# Patient Record
Sex: Female | Born: 1960
Health system: Southern US, Community
[De-identification: ages and names within clinical notes are randomized; demographics above are authoritative.]

## PROBLEM LIST (undated history)

## (undated) DIAGNOSIS — G039 Meningitis, unspecified: Secondary | ICD-10-CM

## (undated) DIAGNOSIS — R002 Palpitations: Secondary | ICD-10-CM

## (undated) DIAGNOSIS — B001 Herpesviral vesicular dermatitis: Secondary | ICD-10-CM

## (undated) DIAGNOSIS — H269 Unspecified cataract: Secondary | ICD-10-CM

## (undated) DIAGNOSIS — K589 Irritable bowel syndrome without diarrhea: Secondary | ICD-10-CM

## (undated) DIAGNOSIS — E7800 Pure hypercholesterolemia, unspecified: Secondary | ICD-10-CM

## (undated) DIAGNOSIS — K219 Gastro-esophageal reflux disease without esophagitis: Secondary | ICD-10-CM

## (undated) DIAGNOSIS — B009 Herpesviral infection, unspecified: Secondary | ICD-10-CM

## (undated) HISTORY — DX: Unspecified cataract: H26.9

## (undated) HISTORY — DX: Irritable bowel syndrome, unspecified: K58.9

## (undated) HISTORY — PX: COLONOSCOPY: SHX174

## (undated) HISTORY — DX: Pure hypercholesterolemia, unspecified: E78.00

## (undated) HISTORY — DX: Gastro-esophageal reflux disease without esophagitis: K21.9

## (undated) HISTORY — DX: Meningitis, unspecified: G03.9

## (undated) HISTORY — DX: Herpesviral infection, unspecified: B00.9

## (undated) HISTORY — DX: Palpitations: R00.2

## (undated) HISTORY — PX: OTHER SURGICAL HISTORY: SHX169

## (undated) HISTORY — DX: Herpesviral vesicular dermatitis: B00.1

---

## 1990-08-11 DIAGNOSIS — G039 Meningitis, unspecified: Secondary | ICD-10-CM

## 1990-08-11 HISTORY — DX: Meningitis, unspecified: G03.9

## 1993-08-11 HISTORY — PX: ANKLE FRACTURE SURGERY: SHX122

## 1998-09-14 ENCOUNTER — Emergency Department (HOSPITAL_COMMUNITY): Admission: EM | Admit: 1998-09-14 | Discharge: 1998-09-14 | Payer: Self-pay | Admitting: Emergency Medicine

## 1999-06-17 ENCOUNTER — Other Ambulatory Visit: Admission: RE | Admit: 1999-06-17 | Discharge: 1999-06-17 | Payer: Self-pay | Admitting: Orthopedic Surgery

## 2001-10-11 ENCOUNTER — Other Ambulatory Visit: Admission: RE | Admit: 2001-10-11 | Discharge: 2001-10-11 | Payer: Self-pay | Admitting: Obstetrics and Gynecology

## 2002-06-09 ENCOUNTER — Other Ambulatory Visit: Admission: RE | Admit: 2002-06-09 | Discharge: 2002-06-09 | Payer: Self-pay | Admitting: Obstetrics and Gynecology

## 2002-10-18 ENCOUNTER — Encounter: Payer: Self-pay | Admitting: Obstetrics and Gynecology

## 2002-10-18 ENCOUNTER — Encounter: Admission: RE | Admit: 2002-10-18 | Discharge: 2002-10-18 | Payer: Self-pay | Admitting: Obstetrics and Gynecology

## 2004-01-17 ENCOUNTER — Encounter: Admission: RE | Admit: 2004-01-17 | Discharge: 2004-01-17 | Payer: Self-pay | Admitting: Obstetrics and Gynecology

## 2004-07-23 ENCOUNTER — Ambulatory Visit: Payer: Self-pay | Admitting: Family Medicine

## 2004-12-24 ENCOUNTER — Other Ambulatory Visit: Admission: RE | Admit: 2004-12-24 | Discharge: 2004-12-24 | Payer: Self-pay | Admitting: Obstetrics and Gynecology

## 2005-01-21 ENCOUNTER — Encounter: Admission: RE | Admit: 2005-01-21 | Discharge: 2005-01-21 | Payer: Self-pay | Admitting: Obstetrics and Gynecology

## 2005-02-27 ENCOUNTER — Ambulatory Visit: Payer: Self-pay | Admitting: Family Medicine

## 2006-03-24 ENCOUNTER — Encounter: Admission: RE | Admit: 2006-03-24 | Discharge: 2006-03-24 | Payer: Self-pay | Admitting: Obstetrics and Gynecology

## 2006-11-20 ENCOUNTER — Ambulatory Visit: Payer: Self-pay | Admitting: Family Medicine

## 2007-03-19 ENCOUNTER — Ambulatory Visit: Payer: Self-pay | Admitting: Internal Medicine

## 2007-03-21 ENCOUNTER — Emergency Department (HOSPITAL_COMMUNITY): Admission: EM | Admit: 2007-03-21 | Discharge: 2007-03-21 | Payer: Self-pay | Admitting: Emergency Medicine

## 2007-04-14 ENCOUNTER — Encounter: Admission: RE | Admit: 2007-04-14 | Discharge: 2007-04-14 | Payer: Self-pay | Admitting: Obstetrics and Gynecology

## 2007-08-12 DIAGNOSIS — R002 Palpitations: Secondary | ICD-10-CM

## 2007-08-12 HISTORY — DX: Palpitations: R00.2

## 2007-12-31 ENCOUNTER — Ambulatory Visit: Payer: Self-pay | Admitting: Family Medicine

## 2007-12-31 ENCOUNTER — Encounter (INDEPENDENT_AMBULATORY_CARE_PROVIDER_SITE_OTHER): Payer: Self-pay | Admitting: Internal Medicine

## 2007-12-31 DIAGNOSIS — I499 Cardiac arrhythmia, unspecified: Secondary | ICD-10-CM | POA: Insufficient documentation

## 2008-01-04 ENCOUNTER — Ambulatory Visit: Payer: Self-pay | Admitting: Cardiology

## 2008-01-04 ENCOUNTER — Ambulatory Visit: Payer: Self-pay | Admitting: Family Medicine

## 2008-01-11 LAB — CONVERTED CEMR LAB
ALT: 12 units/L (ref 0–35)
AST: 14 units/L (ref 0–37)
Albumin: 3.5 g/dL (ref 3.5–5.2)
BUN: 8 mg/dL (ref 6–23)
Basophils Relative: 1 % (ref 0.0–1.0)
CO2: 27 meq/L (ref 19–32)
Calcium: 8.9 mg/dL (ref 8.4–10.5)
Chloride: 111 meq/L (ref 96–112)
Cholesterol: 198 mg/dL (ref 0–200)
Creatinine, Ser: 0.8 mg/dL (ref 0.4–1.2)
Eosinophils Absolute: 0.1 10*3/uL (ref 0.0–0.7)
Eosinophils Relative: 1.6 % (ref 0.0–5.0)
Hemoglobin: 12.1 g/dL (ref 12.0–15.0)
Lymphocytes Relative: 27.8 % (ref 12.0–46.0)
MCV: 92.9 fL (ref 78.0–100.0)
Neutro Abs: 4.5 10*3/uL (ref 1.4–7.7)
Neutrophils Relative %: 65.4 % (ref 43.0–77.0)
RBC: 3.89 M/uL (ref 3.87–5.11)
TSH: 0.64 microintl units/mL (ref 0.35–5.50)
VLDL: 23 mg/dL (ref 0–40)
WBC: 6.9 10*3/uL (ref 4.5–10.5)

## 2008-05-05 ENCOUNTER — Encounter: Admission: RE | Admit: 2008-05-05 | Discharge: 2008-05-05 | Payer: Self-pay | Admitting: Obstetrics and Gynecology

## 2008-05-10 ENCOUNTER — Ambulatory Visit: Payer: Self-pay | Admitting: Family Medicine

## 2009-06-14 ENCOUNTER — Encounter: Admission: RE | Admit: 2009-06-14 | Discharge: 2009-06-14 | Payer: Self-pay | Admitting: Obstetrics and Gynecology

## 2010-07-18 ENCOUNTER — Encounter
Admission: RE | Admit: 2010-07-18 | Discharge: 2010-07-18 | Payer: Self-pay | Source: Home / Self Care | Attending: Obstetrics and Gynecology | Admitting: Obstetrics and Gynecology
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
## 2010-12-24 NOTE — Assessment & Plan Note (Signed)
[facility]                                 ON-CALL NOTE   NAME:[name], Mary Greaser                      MRN:          [id number]  DATE:03/21/2007                            DOB:          16-Dec-1960    PRIMARY CARE PHYSICIAN:  Dr. Milinda Antis.   Mary [name] calls in today stating that she was seen on Friday for fever,  congestion and cough.  She was started on an antibiotic secondary to a  bronchitis.  She calls in today stating that she continues to have a  fever today and a headache.  She reports the headache as being moderate.  She denies any neurologic symptoms.  She is also having a productive  cough although improved from Friday.   PLAN:  Advised patient that she needs to be evaluated again today.  Referred patient to [facility].  Patient reports that she  will be going there today.     Leanne Chang, M.D.  Electronically Signed    LA/MedQ  DD: 03/21/2007  DT: 03/22/2007  Job #: [id number]

## 2011-01-27 ENCOUNTER — Encounter: Payer: Self-pay | Admitting: Cardiovascular Disease

## 2011-05-26 LAB — POCT I-STAT CREATININE
Creatinine, Ser: 1
Operator id: 277751

## 2011-05-26 LAB — DIFFERENTIAL
Basophils Absolute: 0
Lymphocytes Relative: 13
Neutro Abs: 6.3
Neutrophils Relative %: 81 — ABNORMAL HIGH

## 2011-05-26 LAB — I-STAT 8, (EC8 V) (CONVERTED LAB)
Bicarbonate: 21
Glucose, Bld: 123 — ABNORMAL HIGH
HCT: 37
Hemoglobin: 12.6
Operator id: 277751
Sodium: 137
TCO2: 22

## 2011-05-26 LAB — CBC
Platelets: 218
RDW: 12.6

## 2011-07-08 ENCOUNTER — Other Ambulatory Visit: Payer: Self-pay | Admitting: Obstetrics and Gynecology

## 2011-07-08 DIAGNOSIS — Z1231 Encounter for screening mammogram for malignant neoplasm of breast: Secondary | ICD-10-CM

## 2011-07-31 ENCOUNTER — Ambulatory Visit
Admission: RE | Admit: 2011-07-31 | Discharge: 2011-07-31 | Disposition: A | Discharge status: Home / Self Care | Payer: 59 | Source: Ambulatory Visit | Attending: Obstetrics and Gynecology | Admitting: Obstetrics and Gynecology

## 2011-07-31 DIAGNOSIS — Z1231 Encounter for screening mammogram for malignant neoplasm of breast: Secondary | ICD-10-CM

## 2012-03-11 ENCOUNTER — Encounter: Payer: Self-pay | Admitting: Family Medicine

## 2012-03-11 ENCOUNTER — Ambulatory Visit (INDEPENDENT_AMBULATORY_CARE_PROVIDER_SITE_OTHER): Payer: BC Managed Care – PPO | Admitting: Family Medicine

## 2012-03-11 VITALS — BP 118/76 | HR 68 | Ht 61.0 in | Wt 150.0 lb

## 2012-03-11 DIAGNOSIS — R635 Abnormal weight gain: Secondary | ICD-10-CM

## 2012-03-11 DIAGNOSIS — E7800 Pure hypercholesterolemia, unspecified: Secondary | ICD-10-CM | POA: Insufficient documentation

## 2012-03-11 DIAGNOSIS — R5383 Other fatigue: Secondary | ICD-10-CM

## 2012-03-11 DIAGNOSIS — R5381 Other malaise: Secondary | ICD-10-CM
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
## 2012-03-11 NOTE — Patient Instructions (Addendum)
Try and exercise 30-60 minutes at least 5 days/week (can be split up in 15 minutes intervals) Trial of lactose-free diet x 2 weeks.  If ongoing bloating, consider trial of gluten-free diet.  Return for fasting bloodwork.  You are likely due for another colonoscopy--you can check with your GI doctor, and we can refer you if a referral is needed.    MyFitnessPal is the app I was referring to, but there are lots similar apps and programs out there to help track your calories/diet etc.

## 2012-03-12 ENCOUNTER — Other Ambulatory Visit: Payer: BC Managed Care – PPO

## 2012-03-12 DIAGNOSIS — R5381 Other malaise: Secondary | ICD-10-CM

## 2012-03-12 DIAGNOSIS — E7800 Pure hypercholesterolemia, unspecified: Secondary | ICD-10-CM

## 2012-03-12 DIAGNOSIS — R635 Abnormal weight gain: Secondary | ICD-10-CM

## 2012-03-12 LAB — COMPREHENSIVE METABOLIC PANEL
ALT: 21 U/L (ref 0–35)
AST: 20 U/L (ref 0–37)
Albumin: 4.1 g/dL (ref 3.5–5.2)
Calcium: 9.4 mg/dL (ref 8.4–10.5)
Chloride: 105 mEq/L (ref 96–112)
Potassium: 4.1 mEq/L (ref 3.5–5.3)
Sodium: 141 mEq/L (ref 135–145)
Total Protein: 6.4 g/dL (ref 6.0–8.3)

## 2012-03-12 LAB — CBC WITH DIFFERENTIAL/PLATELET
Basophils Relative: 0 % (ref 0–1)
Eosinophils Absolute: 0.2 10*3/uL (ref 0.0–0.7)
Hemoglobin: 12.6 g/dL (ref 12.0–15.0)
MCH: 31.1 pg (ref 26.0–34.0)
MCHC: 34.8 g/dL (ref 30.0–36.0)
Monocytes Absolute: 0.5 10*3/uL (ref 0.1–1.0)
Monocytes Relative: 8 % (ref 3–12)
Neutrophils Relative %: 53 % (ref 43–77)

## 2012-03-12 LAB — LIPID PANEL
Cholesterol: 225 mg/dL — ABNORMAL HIGH (ref 0–200)
LDL Cholesterol: 149 mg/dL — ABNORMAL HIGH (ref 0–99)
VLDL: 23 mg/dL (ref 0–40)

## 2012-03-14 ENCOUNTER — Encounter: Payer: Self-pay | Admitting: Family Medicine

## 2012-07-16 ENCOUNTER — Other Ambulatory Visit: Payer: Self-pay | Admitting: Obstetrics and Gynecology

## 2012-07-16 DIAGNOSIS — Z1231 Encounter for screening mammogram for malignant neoplasm of breast: Secondary | ICD-10-CM

## 2012-07-29 ENCOUNTER — Encounter: Payer: Self-pay | Admitting: Internal Medicine

## 2012-08-09 ENCOUNTER — Encounter: Payer: Self-pay | Admitting: *Deleted

## 2012-08-20 ENCOUNTER — Ambulatory Visit
Admission: RE | Admit: 2012-08-20 | Discharge: 2012-08-20 | Disposition: A | Discharge status: Home / Self Care | Payer: BC Managed Care – PPO | Source: Ambulatory Visit | Attending: Obstetrics and Gynecology | Admitting: Obstetrics and Gynecology

## 2012-08-20 DIAGNOSIS — Z1231 Encounter for screening mammogram for malignant neoplasm of breast: Secondary | ICD-10-CM

## 2012-08-24 ENCOUNTER — Other Ambulatory Visit: Payer: Self-pay | Admitting: Obstetrics and Gynecology

## 2012-08-24 DIAGNOSIS — R928 Other abnormal and inconclusive findings on diagnostic imaging of breast: Secondary | ICD-10-CM

## 2012-08-25 ENCOUNTER — Ambulatory Visit
Admission: RE | Admit: 2012-08-25 | Discharge: 2012-08-25 | Disposition: A | Discharge status: Home / Self Care | Payer: BC Managed Care – PPO | Source: Ambulatory Visit | Attending: Obstetrics and Gynecology | Admitting: Obstetrics and Gynecology

## 2012-08-25 DIAGNOSIS — R928 Other abnormal and inconclusive findings on diagnostic imaging of breast: Secondary | ICD-10-CM

## 2012-09-10 ENCOUNTER — Ambulatory Visit: Payer: BC Managed Care – PPO | Admitting: Internal Medicine

## 2012-09-15 ENCOUNTER — Encounter: Payer: Self-pay | Admitting: Internal Medicine

## 2012-10-12 ENCOUNTER — Encounter: Payer: Self-pay | Admitting: Internal Medicine

## 2012-10-12 ENCOUNTER — Ambulatory Visit (INDEPENDENT_AMBULATORY_CARE_PROVIDER_SITE_OTHER): Payer: BC Managed Care – PPO | Admitting: Internal Medicine

## 2012-10-12 VITALS — BP 110/70 | HR 64 | Ht 61.0 in | Wt 154.8 lb

## 2012-10-12 DIAGNOSIS — Z1211 Encounter for screening for malignant neoplasm of colon: Secondary | ICD-10-CM

## 2012-10-12 DIAGNOSIS — Z800 Family history of malignant neoplasm of digestive organs: Secondary | ICD-10-CM

## 2012-10-12 MED ORDER — MOVIPREP 100 G PO SOLR: 1.0000 | Freq: Once | ORAL | Status: DC

## 2012-10-12 NOTE — Patient Instructions (Addendum)
You have been scheduled for a colonoscopy with propofol. Please follow written instructions given to you at your visit today.  Please pick up your prep kit at the pharmacy within the next 1-3 days. If you use inhalers (even only as needed) or a CPAP machine, please bring them with you on the day of your procedure.  CC: Dr Joselyn Arrow, Dr Hilbert Bible
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
## 2012-10-26 ENCOUNTER — Encounter: Payer: Self-pay | Admitting: Internal Medicine

## 2012-10-26 ENCOUNTER — Ambulatory Visit (AMBULATORY_SURGERY_CENTER): Payer: BC Managed Care – PPO | Admitting: Internal Medicine

## 2012-10-26 VITALS — BP 114/70 | HR 67 | Temp 98.6°F | Resp 15 | Ht 61.0 in | Wt 154.0 lb

## 2012-10-26 DIAGNOSIS — Z800 Family history of malignant neoplasm of digestive organs: Secondary | ICD-10-CM

## 2012-10-26 DIAGNOSIS — Z1211 Encounter for screening for malignant neoplasm of colon: Secondary | ICD-10-CM

## 2012-10-26 LAB — HM COLONOSCOPY: HM Colonoscopy: NORMAL

## 2012-10-26 MED ORDER — HYDROCORTISONE ACETATE 25 MG RE SUPP: 25.0000 mg | Freq: Every evening | RECTAL | Status: DC | PRN

## 2012-10-26 MED ORDER — SODIUM CHLORIDE 0.9 % IV SOLN: 500.0000 mL | INTRAVENOUS | Status: DC

## 2012-10-26 NOTE — Patient Instructions (Addendum)
Discharge instructions given with verbal understanding. Normal exam. Resume previous medications. YOU HAD AN ENDOSCOPIC PROCEDURE TODAY AT THE [facility]: Refer to the procedure report that was given to you for any specific questions about what was found during the examination.  If the procedure report does not answer your questions, please call your gastroenterologist to clarify.  If you requested that your care partner not be given the details of your procedure findings, then the procedure report has been included in a sealed envelope for you to review at your convenience later.  YOU SHOULD EXPECT: Some feelings of bloating in the abdomen. Passage of more gas than usual.  Walking can help get rid of the air that was put into your GI tract during the procedure and reduce the bloating. If you had a lower endoscopy (such as a colonoscopy or flexible sigmoidoscopy) you may notice spotting of blood in your stool or on the toilet paper. If you underwent a bowel prep for your procedure, then you may not have a normal bowel movement for a few days.  DIET: Your first meal following the procedure should be a light meal and then it is ok to progress to your normal diet.  A half-sandwich or bowl of soup is an example of a good first meal.  Heavy or fried foods are harder to digest and may make you feel nauseous or bloated.  Likewise meals heavy in dairy and vegetables can cause extra gas to form and this can also increase the bloating.  Drink plenty of fluids but you should avoid alcoholic beverages for 24 hours.  ACTIVITY: Your care partner should take you home directly after the procedure.  You should plan to take it easy, moving slowly for the rest of the day.  You can resume normal activity the day after the procedure however you should NOT DRIVE or use heavy machinery for 24 hours (because of the sedation medicines used during the test).    SYMPTOMS TO REPORT IMMEDIATELY: A gastroenterologist  can be reached at any hour.  During normal business hours, 8:30 AM to 5:00 PM Monday through Friday, call [phone].  After hours and on weekends, please call the GI answering service at [phone] who will take a message and have the physician on call contact you.   Following lower endoscopy (colonoscopy or flexible sigmoidoscopy):  Excessive amounts of blood in the stool  Significant tenderness or worsening of abdominal pains  Swelling of the abdomen that is new, acute  Fever of 100F or higher  FOLLOW UP: If any biopsies were taken you will be contacted by phone or by letter within the next 1-3 weeks.  Call your gastroenterologist if you have not heard about the biopsies in 3 weeks.  Our staff will call the home number listed on your records the next business day following your procedure to check on you and address any questions or concerns that you may have at that time regarding the information given to you following your procedure. This is a courtesy call and so if there is no answer at the home number and we have not heard from you through the emergency physician on call, we will assume that you have returned to your regular daily activities without incident.  SIGNATURES/CONFIDENTIALITY: You and/or your care partner have signed paperwork which will be entered into your electronic medical record.  These signatures attest to the fact that that the information above on your After Visit Summary has been reviewed   and is understood.  Full responsibility of the confidentiality of this discharge information lies with you and/or your care-partner. 

## 2012-10-26 NOTE — Op Note (Signed)
[facility] [address] [place], [address]   COLONOSCOPY PROCEDURE REPORT  PATIENT: Mary, [name]  MR#: [id number] BIRTHDATE: 08/07/1961 , 52  yrs. old GENDER: Female ENDOSCOPIST: Hart Carwin, MD REFERRED Edison Simon, M.D.  Dr Hilbert Bible PROCEDURE DATE:  10/26/2012 PROCEDURE:   Colonoscopy, screening ASA CLASS:   Class I INDICATIONS:colonoscopy 2000, GP with colon cancer. MEDICATIONS: MAC sedation, administered by CRNA and propofol (Diprivan) 250mg  IV  DESCRIPTION OF PROCEDURE:   After the risks benefits and alternatives of the procedure were thoroughly explained, informed consent was obtained.  A digital rectal exam revealed no abnormalities of the rectum.   The LB [id number]  endoscope was introduced through the anus and advanced to the cecum, which was identified by both the appendix and ileocecal valve. No adverse events experienced.   The quality of the prep was excellent, using MoviPrep  The instrument was then slowly withdrawn as the colon was fully examined.      COLON FINDINGS: A normal appearing cecum, ileocecal valve, and appendiceal orifice were identified.  The ascending, hepatic flexure, transverse, splenic flexure, descending, sigmoid colon and rectum appeared unremarkable.  No polyps or cancers were seen. Retroflexed views revealed no abnormalities. The time to cecum=  . Withdrawal time=  .  The scope was withdrawn and the procedure completed. COMPLICATIONS: There were no complications.  ENDOSCOPIC IMPRESSION: Normal colon  RECOMMENDATIONS: High fiber diet Recall colonoscopy in 10 years  eSigned:  Hart Carwin, MD 10/26/2012 5:00 PM   cc:

## 2012-10-26 NOTE — Progress Notes (Signed)
Lidocaine-40mg IV prior to Propofol InductionPropofol given over incremental dosages 

## 2012-10-26 NOTE — Progress Notes (Signed)
Patient did not experience any of the following events: a burn prior to discharge; a fall within the facility; wrong site/side/patient/procedure/implant event; or a hospital transfer or hospital admission upon discharge from the facility. (G8907) Patient did not have preoperative order for IV antibiotic SSI prophylaxis. (G8918)  

## 2012-10-27 ENCOUNTER — Telehealth: Payer: Self-pay | Admitting: *Deleted

## 2012-10-27 NOTE — Telephone Encounter (Signed)
No answer, name identifier, message left to call if any questions or concerns.  

## 2012-11-12 ENCOUNTER — Encounter: Payer: Self-pay | Admitting: Internal Medicine

## 2012-12-09 ENCOUNTER — Encounter: Payer: Self-pay | Admitting: Family Medicine

## 2012-12-09 ENCOUNTER — Ambulatory Visit (INDEPENDENT_AMBULATORY_CARE_PROVIDER_SITE_OTHER): Payer: BC Managed Care – PPO | Admitting: Family Medicine

## 2012-12-09 VITALS — BP 110/68 | HR 76 | Ht 60.0 in | Wt 153.0 lb

## 2012-12-09 DIAGNOSIS — E663 Overweight: Secondary | ICD-10-CM

## 2012-12-09 DIAGNOSIS — M7062 Trochanteric bursitis, left hip: Secondary | ICD-10-CM | POA: Insufficient documentation

## 2012-12-09 DIAGNOSIS — Z6825 Body mass index (BMI) 25.0-25.9, adult: Secondary | ICD-10-CM

## 2012-12-09 DIAGNOSIS — M76899 Other specified enthesopathies of unspecified lower limb, excluding foot: Secondary | ICD-10-CM

## 2012-12-09 DIAGNOSIS — M7061 Trochanteric bursitis, right hip: Secondary | ICD-10-CM

## 2012-12-09 NOTE — Patient Instructions (Signed)
Hip Bursitis Bursitis is a swelling and soreness (inflammation) of a fluid-filled sac (bursa). This sac overlies and protects the joints.  CAUSES   Injury.  Overuse of the muscles surrounding the joint.  Arthritis.  Gout.  Infection.  Cold weather.  Inadequate warm-up and conditioning prior to activities. The cause may not be known.  SYMPTOMS   Mild to severe irritation.  Tenderness and swelling over the outside of the hip.  Pain with motion of the hip.  If the bursa becomes infected, a fever may be present. Redness, tenderness, and warmth will develop over the hip. Symptoms usually lessen in 3 to 4 weeks with treatment, but can come back. TREATMENT If conservative treatment does not work, your caregiver may advise draining the bursa and injecting cortisone into the area. This may speed up the healing process. This may also be used as an initial treatment of choice. HOME CARE INSTRUCTIONS   Apply ice to the affected area for 15 to 20 minutes every 3 to 4 hours while awake for the first 2 days. Put the ice in a plastic bag and place a towel between the bag of ice and your skin.  Rest the painful joint as much as possible, but continue to put the joint through a normal range of motion at least 4 times per day. When the pain lessens, begin normal, slow movements and usual activities to help prevent stiffness of the hip.  Only take over-the-counter or prescription medicines for pain, discomfort, or fever as directed by your caregiver.  Use crutches to limit weight bearing on the hip joint, if advised.  Elevate your painful hip to reduce swelling. Use pillows for propping and cushioning your legs and hips.  Gentle massage may provide comfort and decrease swelling. SEEK IMMEDIATE MEDICAL CARE IF:   Your pain increases even during treatment, or you are not improving.  You have a fever.  You have heat and inflammation over the involved bursa.  You have any other questions  or concerns. MAKE SURE YOU:   Understand these instructions.  Will watch your condition.  Will get help right away if you are not doing well or get worse. Document Released: 01/17/2002 Document Revised: 10/20/2011 Document Reviewed: 08/16/2008 [facility] Patient Information 2013 [place], [place].   Use 1-2 aleve twice daily with food for up to 2 weeks.  Use regularly twice daily for at least 1 week.  Eat more fruits and vegetables, increase protein, limit portion sizes of carbs.  Eat breakfast or small frequent meals/snacks that are healthy. Continue your 30 min of exercise daily (minimum).  Ideally 45-60 minutes, or more vigorous 30 minutes (run vs walk).
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
## 2013-01-05 ENCOUNTER — Ambulatory Visit: Payer: BC Managed Care – PPO | Admitting: Family Medicine

## 2013-03-17 ENCOUNTER — Telehealth: Payer: Self-pay | Admitting: Family Medicine

## 2013-03-17 NOTE — Telephone Encounter (Signed)
Please call    Came in recently to talk to you about weight loss. She has tried to watch diet, loose weight etc Harder during pms and would like some help  Friend let her try 5 days of ?phenamine and it helped  She wanted to see if you would consider giving Rx

## 2013-03-17 NOTE — Telephone Encounter (Signed)
Spoke with patient and went over Dr.Knapp's recommedations. Patient refused to schedule an office visit stating that is why she canceled the 4 week follow up because she doesn't need a visit, just wants the rx and feels that she should be able to take for 30 days and THEN follow up. She told me that she already tried her friends phentermine and that it obviously works so she just wants an rx. She also did lots of research online and does not need an appointment. I offered 3 times to schedule her an appt. She declined. I did tell patient if she decides to come in to call and I would gladly get her an appointment. She told me she will just go to a weightloss clinic.

## 2013-03-17 NOTE — Telephone Encounter (Signed)
She was seen here in May. She was counseled extensively re: diet, exercise and discussed phentermine at her visit.  At that time, she didn't meet criteria (but was close), as you have to have BMI>30 without other comorbidities.  She was supposed to return in 4 weeks to f/u on her weight, and to determine whether she would benefit from a very short course (if she was exercising and eating appropriately, because if NOT, then any weight loss from the pills alone would be gained right back once the medication was stopped).  It looks like she canceled her f/u appt.  She needs f/u OV, as we previously discussed.  If she is compliant with diet and exercise program, and hasn't already lost a significant amt of weight since last visit, then very short-term use of phentermine might be appropriate. If she is not watching her diet, exercising or if she has lost a significant amount of weight already (such that her BMI is much lower), than she wouldn't be a candidate for the medication.

## 2013-03-24 ENCOUNTER — Other Ambulatory Visit (HOSPITAL_COMMUNITY): Payer: Self-pay | Admitting: Obstetrics and Gynecology

## 2013-03-24 DIAGNOSIS — R1900 Intra-abdominal and pelvic swelling, mass and lump, unspecified site: Secondary | ICD-10-CM

## 2013-03-25 ENCOUNTER — Other Ambulatory Visit (HOSPITAL_COMMUNITY): Payer: Self-pay | Admitting: Obstetrics and Gynecology

## 2013-03-25 ENCOUNTER — Ambulatory Visit (HOSPITAL_COMMUNITY): Admission: RE | Admit: 2013-03-25 | Payer: BC Managed Care – PPO | Source: Ambulatory Visit

## 2013-03-25 ENCOUNTER — Ambulatory Visit (HOSPITAL_COMMUNITY): Payer: BC Managed Care – PPO

## 2013-03-25 ENCOUNTER — Ambulatory Visit (HOSPITAL_COMMUNITY)
Admission: RE | Admit: 2013-03-25 | Discharge: 2013-03-25 | Disposition: A | Discharge status: Home / Self Care | Payer: BC Managed Care – PPO | Source: Ambulatory Visit | Attending: Obstetrics and Gynecology | Admitting: Obstetrics and Gynecology

## 2013-03-25 DIAGNOSIS — K769 Liver disease, unspecified: Secondary | ICD-10-CM | POA: Insufficient documentation

## 2013-03-25 DIAGNOSIS — R1900 Intra-abdominal and pelvic swelling, mass and lump, unspecified site: Secondary | ICD-10-CM | POA: Insufficient documentation

## 2013-03-25 DIAGNOSIS — R109 Unspecified abdominal pain: Secondary | ICD-10-CM | POA: Insufficient documentation

## 2014-03-15 IMAGING — US US BREAST BILAT
1 series · 8 of 8 positions shown · non-contrast
Comparison: 07/31/2011 and 03/24/2006 mammograms.

On physical exam, no discrete palpable masses are identified.

CLINICAL DATA: 51-year-old female with abnormal screening
mammogram / tomogram with bilateral circumscribed breast masses.

BILATERAL BREAST ULTRASOUND

[Series 1: us breast bilat · 8 of 8 slices shown]
[im 1/8]
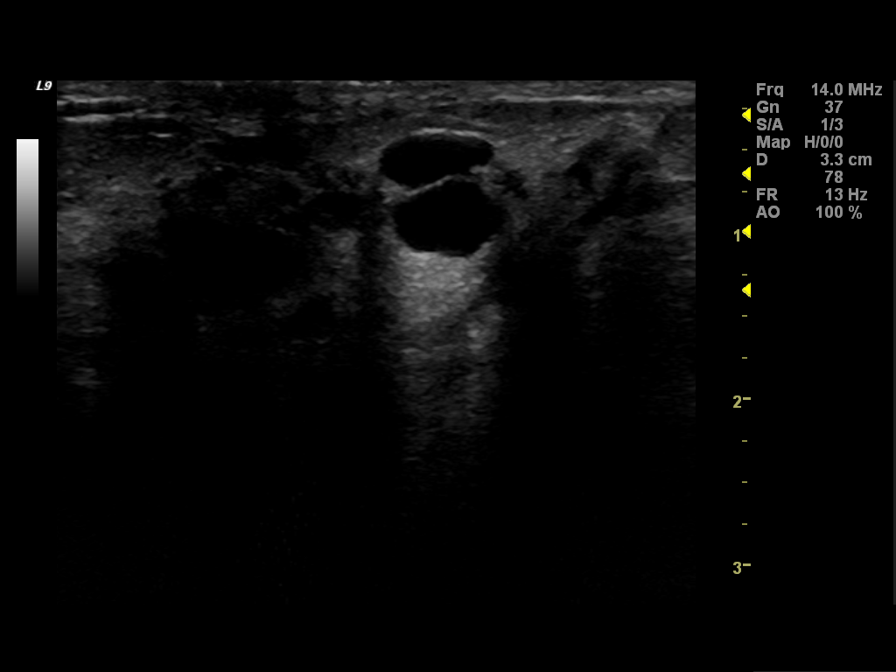
[im 2/8]
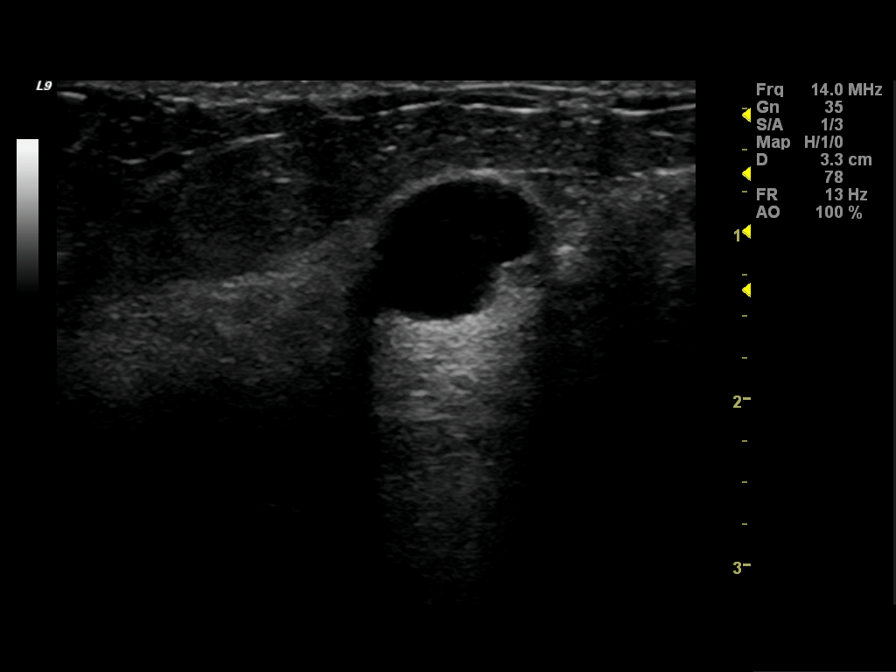
[im 3/8]
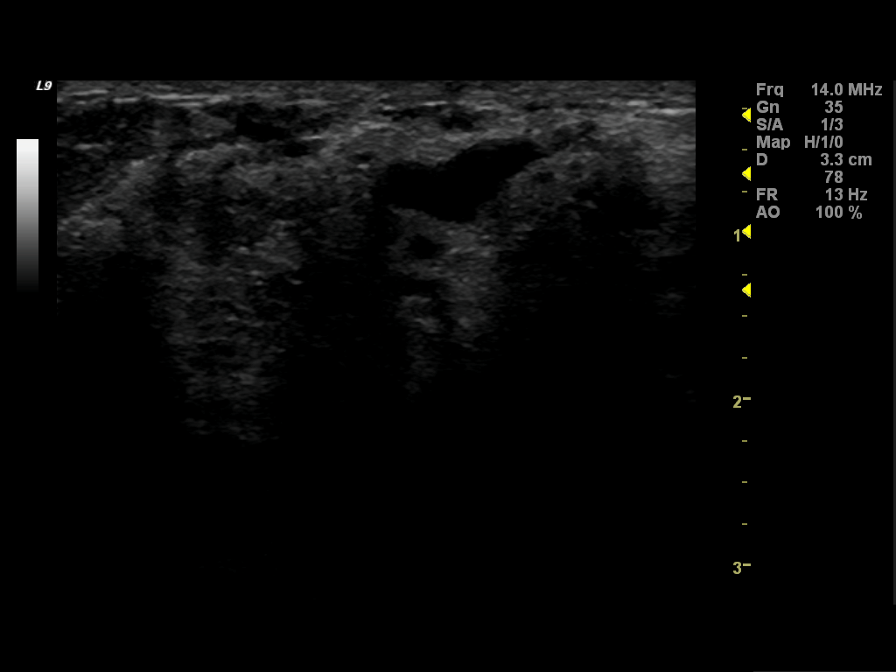
[im 4/8]
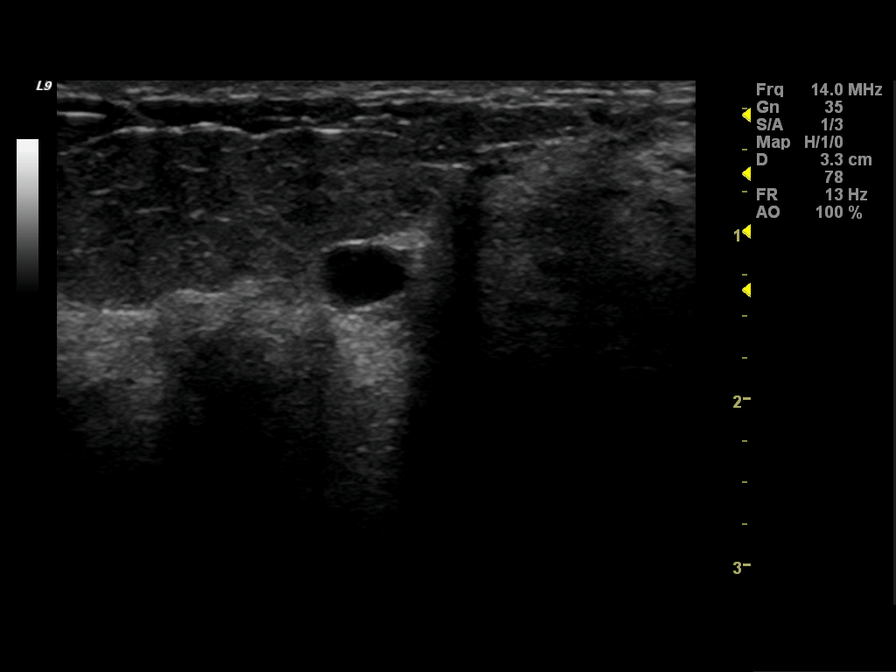
[im 5/8]
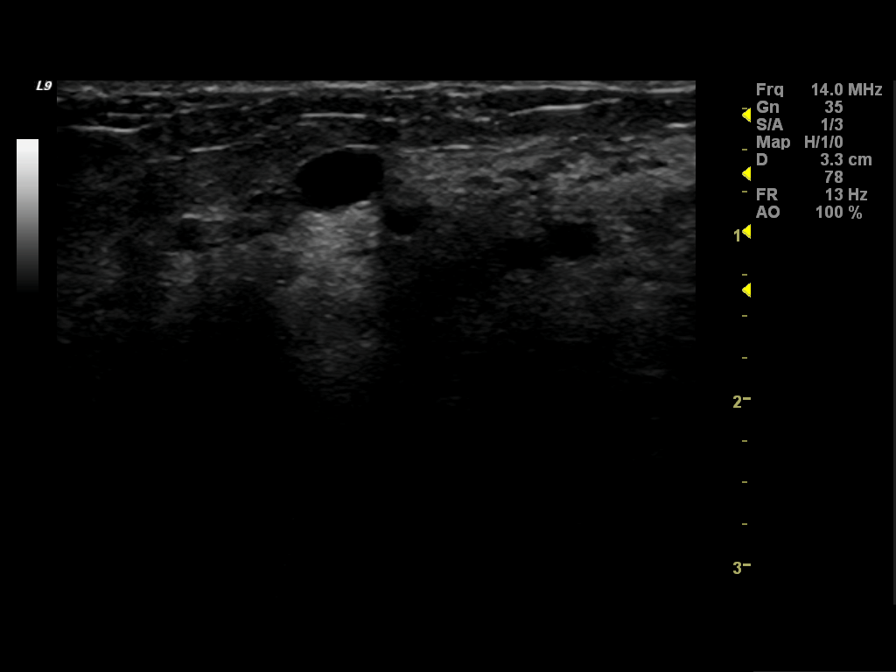
[im 6/8]
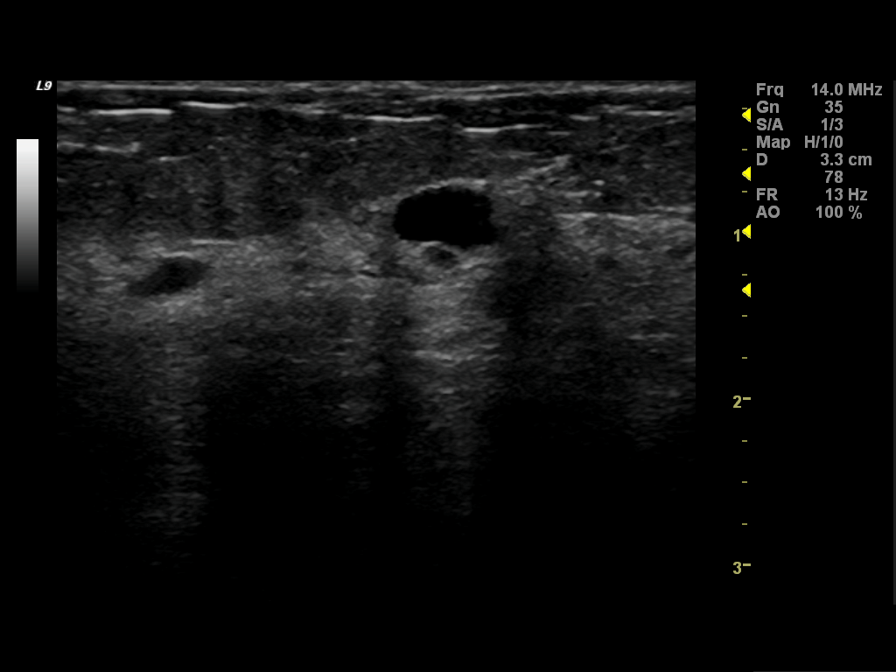
[im 7/8]
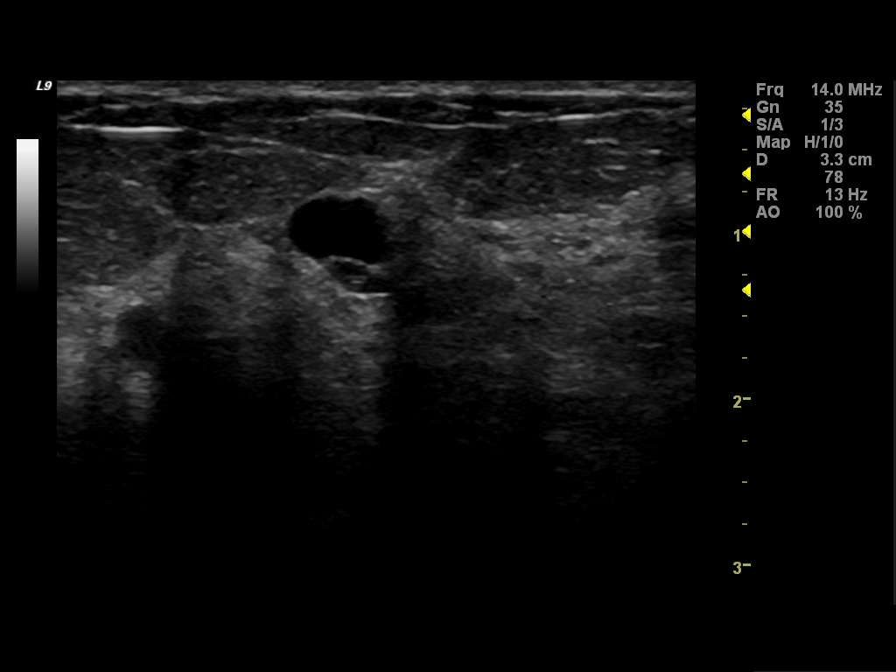
[im 8/8]
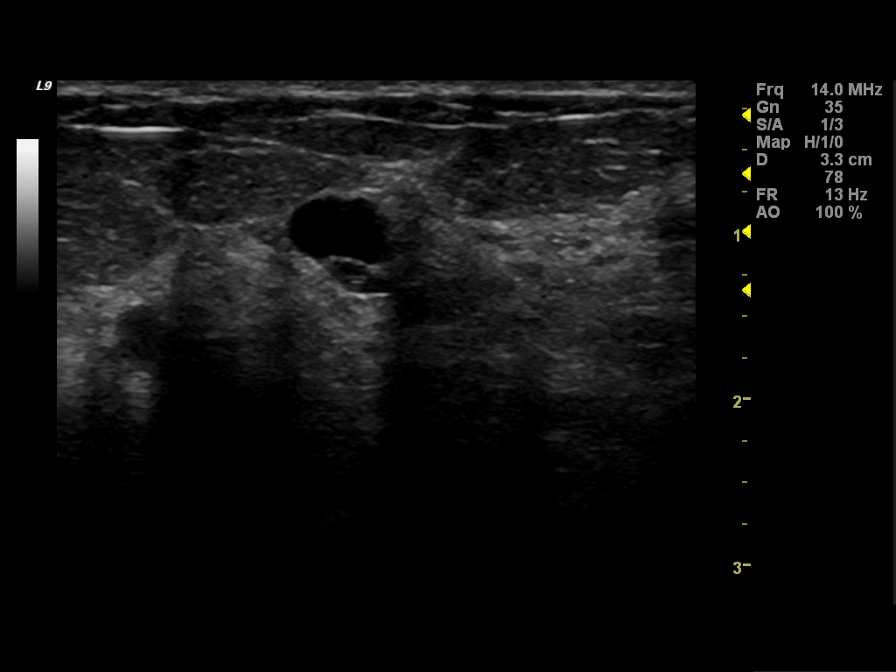

[8 of 8 positions shown; findings below may reference images not displayed]

FINDINGS: Ultrasound is performed, showing multiple simple and
mildly complicated cysts within both breasts, corresponding to the
screening study findings.  There is no evidence of suspicious solid
mass, distortion or worrisome areas of shadowing.
IMPRESSION: Benign cysts within both breasts, corresponding to the screening
study findings.

BI-RADS CATEGORY 2:  Benign finding(s).

RECOMMENDATION:
Bilateral screening mammograms in 1 year.

I discussed the findings and recommendations with the patient and
her questions answered.  She was encouraged to begin/continue
monthly self exams and to contact her primary physician if any
changes noted. A written report was given to the patient.

## 2014-04-18 ENCOUNTER — Ambulatory Visit (INDEPENDENT_AMBULATORY_CARE_PROVIDER_SITE_OTHER): Payer: BC Managed Care – PPO | Admitting: Family Medicine

## 2014-04-18 ENCOUNTER — Encounter: Payer: Self-pay | Admitting: Family Medicine

## 2014-04-18 VITALS — BP 114/74 | HR 82 | Wt 160.0 lb

## 2014-04-18 DIAGNOSIS — G8929 Other chronic pain: Secondary | ICD-10-CM

## 2014-04-18 DIAGNOSIS — Z23 Encounter for immunization: Secondary | ICD-10-CM

## 2014-04-18 DIAGNOSIS — M25519 Pain in unspecified shoulder: Secondary | ICD-10-CM

## 2014-04-18 DIAGNOSIS — M542 Cervicalgia: Secondary | ICD-10-CM

## 2014-04-18 NOTE — Patient Instructions (Signed)
Heat, stretching, proper posturing and sitting.  range of motion exercises. Pay attention to anything that makes this better or worse. Try 2 Aleve twice a day for the next 10 days

## 2014-04-18 NOTE — Progress Notes (Signed)
   Subjective:    Patient ID: Mary [name], female    DOB: 1960-10-06, 53 y.o.   MRN: [id number]  HPI She is here complaining of a 12 month history of neck shoulder as well as arm discomfort. The neck and shoulder discomfort tend to get worse later on in the afternoon and evening. The symptoms are intermittent in nature. She did have several episodes of aching as well as cold tingling sensation in her arms and hands that lasted up roughly one hour but then go away easily. She cannot relate these symptoms to any position, stress. There's been no numbness, tingling or weakness except as already described.   Review of Systems     Objective:   Physical Exam Alert and in no distress. Full motion of the neck. Full motion of the shoulder. Negative drop arm test. No laxity noted. Supraspinatus testing negative. Neer's and Hawkins test negative. Normal motor, sensory and DTRs of her arms      Assessment & Plan:  Neck pain of over 3 months duration  Pain in joint, shoulder region, unspecified laterality  Need for prophylactic vaccination and inoculation against influenza - Plan: Flu Vaccine QUAD 36+ mos IM  recommend conservative care for this including heat, stretching, anti-inflammatory. Also discussed possible physical therapy referral. If she has continued difficulty, she will return here for further consultation.

## 2014-11-07 ENCOUNTER — Ambulatory Visit (INDEPENDENT_AMBULATORY_CARE_PROVIDER_SITE_OTHER): Payer: BLUE CROSS/BLUE SHIELD | Admitting: Family Medicine

## 2014-11-07 ENCOUNTER — Encounter: Payer: Self-pay | Admitting: Family Medicine

## 2014-11-07 VITALS — BP 106/70 | HR 86 | Ht 61.0 in | Wt 158.0 lb

## 2014-11-07 DIAGNOSIS — M7582 Other shoulder lesions, left shoulder: Secondary | ICD-10-CM

## 2014-11-07 DIAGNOSIS — K146 Glossodynia: Secondary | ICD-10-CM

## 2014-11-07 NOTE — Progress Notes (Signed)
   Subjective:    Patient ID: Mary [name], female    DOB: 23-Jul-1961, 54 y.o.   MRN: [id number]  HPI She is here for evaluation of a 2 month history of right lateral tongue discomfort that she describes as a raw feeling and is worse with drinking and eating. She denies injury to the tongue or a previous problem with it. She denies loss of taste. She is not a smoker and rarely drinks alcohol. Her last dental exam was approximately 8 months ago. No sore throat, earache, fever, chills,dysphasia or coughing. She also complains of an 8 month history of intermittent left deltoid pain that is brought on by reaching behind her back with her left arm or with externally rotating and abducting her left shoulder. Occasionally has pain when laying on her left shoulder. Denies injury. No numbness, tingling or weakness.  Review of Systems  All other systems reviewed and are negative.      Objective:   Physical Exam Alert and in no distress. Tympanic membranes and canals are normal. Oral mucosa pink, moist and intact, no lesions.  Tongue is pink, moist, normal vasculature, no ulcerations or palpable masses . Throat is clear. Neck is supple without adenopathy. Left shoulder without asymmetry or obvious deformity, non tender, Full ROM. Pain to left deltoid with abduction, external and internal rotation. Negative Neer's and Hawkins tests.negative sulcus sign        Assessment & Plan:  Tongue pain  Rotator cuff tendinitis, left   Since it is not clear was is causing her tongue discomfort she will make an appointment and follow up with her dentist. Discussed the possibility of a referral to an oral surgeon but she would prefer to follow up with her dentist which is reasonable. Left posterior shoulder was prepped with Betadine. 40 milligrams of Kenalog and 3 mL of Xylocaine was injected into the subacromial bursa without difficulty. After a few minutes she was not sure whether it helped or not. If no  improvement in her symptoms, she will call and further evaluation with x-rays and possible scans will be entertained.

## 2014-11-16 ENCOUNTER — Telehealth: Payer: Self-pay | Admitting: Internal Medicine

## 2014-11-16 NOTE — Telephone Encounter (Signed)
Have her set up an appointment so we can discuss this

## 2014-11-16 NOTE — Telephone Encounter (Signed)
Pt would like to come in and get blood work done. She had high cholesterol years ago and she is definitely wanting to get that checked. If it is okay then please put in future orders so i can let pt know

## 2014-11-17 NOTE — Telephone Encounter (Signed)
Left word for word message on machine  

## 2014-12-25 ENCOUNTER — Encounter: Payer: Self-pay | Admitting: Family Medicine

## 2014-12-25 ENCOUNTER — Ambulatory Visit (INDEPENDENT_AMBULATORY_CARE_PROVIDER_SITE_OTHER): Payer: BLUE CROSS/BLUE SHIELD | Admitting: Family Medicine

## 2014-12-25 VITALS — BP 80/60 | HR 72 | Ht 61.0 in | Wt 154.0 lb

## 2014-12-25 DIAGNOSIS — E7800 Pure hypercholesterolemia, unspecified: Secondary | ICD-10-CM

## 2014-12-25 DIAGNOSIS — Z23 Encounter for immunization: Secondary | ICD-10-CM | POA: Diagnosis not present

## 2014-12-25 DIAGNOSIS — K219 Gastro-esophageal reflux disease without esophagitis: Secondary | ICD-10-CM | POA: Diagnosis not present

## 2014-12-25 DIAGNOSIS — Z0000 Encounter for general adult medical examination without abnormal findings: Secondary | ICD-10-CM

## 2014-12-25 LAB — COMPREHENSIVE METABOLIC PANEL
ALBUMIN: 4 g/dL (ref 3.5–5.2)
ALK PHOS: 72 U/L (ref 39–117)
ALT: 19 U/L (ref 0–35)
AST: 15 U/L (ref 0–37)
BILIRUBIN TOTAL: 0.4 mg/dL (ref 0.2–1.2)
BUN: 16 mg/dL (ref 6–23)
CO2: 25 mEq/L (ref 19–32)
Calcium: 9.3 mg/dL (ref 8.4–10.5)
Chloride: 106 mEq/L (ref 96–112)
Creat: 0.8 mg/dL (ref 0.50–1.10)
Glucose, Bld: 83 mg/dL (ref 70–99)
POTASSIUM: 4.6 meq/L (ref 3.5–5.3)
Sodium: 141 mEq/L (ref 135–145)
TOTAL PROTEIN: 6.8 g/dL (ref 6.0–8.3)

## 2014-12-25 LAB — LIPID PANEL
Cholesterol: 250 mg/dL — ABNORMAL HIGH (ref 0–200)
HDL: 48 mg/dL (ref >=46)
LDL CALC: 174 mg/dL — AB (ref 0–99)
Total CHOL/HDL Ratio: 5.2 Ratio
Triglycerides: 141 mg/dL (ref <150)
VLDL: 28 mg/dL (ref 0–40)

## 2014-12-25 NOTE — Progress Notes (Signed)
   Subjective:    Patient ID: Mary [name], female    DOB: 06-Feb-1961, 54 y.o.   MRN: [id number]  HPI She is here for complete examination. She has recently seen her gynecologist. She does have reflux disease and does use Prilosec and occasionally Zantac. She treats her symptoms periodically. Usually this is every other day. She also has a history of hyperlipidemia but presently is on no medications. She is up-to-date on her immunizations as well as colonoscopy and mammogram. Family and social history was reviewed. Work is going well. Home life is good. No chest pain, shortness of breath, irregular heart rate.   Review of Systems  All other systems reviewed and are negative.      Objective:   Physical Exam BP 80/60 mmHg  Pulse 72  Ht 5\' 1"  (1.549 m)  Wt 154 lb (69.854 kg)  BMI 29.11 kg/m2  BP 80/60 mmHg  Pulse 72  Ht 5\' 1"  (1.549 m)  Wt 154 lb (69.854 kg)  BMI 29.11 kg/m2  General Appearance:    Alert, cooperative, no distress, appears stated age  Head:    Normocephalic, without obvious abnormality, atraumatic  Eyes:    PERRL, conjunctiva/corneas clear, EOM's intact, fundi    benign  Ears:    Normal TM's and external ear canals  Nose:   Nares normal, mucosa normal, no drainage or sinus   tenderness  Throat:   Lips, mucosa, and tongue normal; teeth and gums normal  Neck:   Supple, no lymphadenopathy;  thyroid:  no   enlargement/tenderness/nodules; no carotid   bruit or JVD  Back:    Spine nontender, no curvature, ROM normal, no CVA     tenderness  Lungs:     Clear to auscultation bilaterally without wheezes, rales or     ronchi; respirations unlabored  Chest Wall:    No tenderness or deformity   Heart:    Regular rate and rhythm, S1 and S2 normal, no murmur, rub   or gallop  Breast Exam:    Deferred to GYN  Abdomen:     Soft, non-tender, nondistended, normoactive bowel sounds,    no masses, no hepatosplenomegaly  Genitalia:    Deferred to GYN     Extremities:   No  clubbing, cyanosis or edema  Pulses:   2+ and symmetric all extremities  Skin:   Skin color, texture, turgor normal, no rashes or lesions  Lymph nodes:   Cervical, supraclavicular, and axillary nodes normal  Neurologic:   CNII-XII intact, normal strength, sensation and gait; reflexes 2+ and symmetric throughout          Psych:   Normal mood, affect, hygiene and grooming.                                                 Assessment & Plan:  Routine general medical examination at a health care facility - Plan: CBC with Differential/Platelet, Comprehensive metabolic panel, Lipid panel  Pure hypercholesterolemia  Gastroesophageal reflux disease without esophagitis  Need for prophylactic vaccination with combined diphtheria-tetanus-pertussis (DTP) vaccine - Plan: Tdap vaccine greater than or equal to 7yo IM

## 2015-01-22 ENCOUNTER — Ambulatory Visit (INDEPENDENT_AMBULATORY_CARE_PROVIDER_SITE_OTHER): Payer: BLUE CROSS/BLUE SHIELD | Admitting: Family Medicine

## 2015-01-22 VITALS — BP 112/70 | HR 84 | Temp 98.1°F | Wt 154.0 lb

## 2015-01-22 DIAGNOSIS — R590 Localized enlarged lymph nodes: Secondary | ICD-10-CM

## 2015-01-22 DIAGNOSIS — J029 Acute pharyngitis, unspecified: Secondary | ICD-10-CM | POA: Diagnosis not present

## 2015-01-22 LAB — POCT RAPID STREP A (OFFICE): Rapid Strep A Screen: NEGATIVE

## 2015-01-22 MED ORDER — AMOXICILLIN 875 MG PO TABS: 875.0000 mg | ORAL_TABLET | Freq: Two times a day (BID) | ORAL | Status: DC

## 2015-01-22 NOTE — Patient Instructions (Signed)
If the Antibiotic does not do any good call me and I will set up for a scan

## 2015-01-22 NOTE — Progress Notes (Signed)
   Subjective:    Patient ID: Lambert Mody, female    DOB: 05/21/1961, 54 y.o.   MRN: [id number]  HPI She has a two-week history of right earache as well as soreness on the right side of her neck. She has a previous history of a lesion on her tongue on the right. She saw her dentist who referred her to an oral surgeon. Apparently they will be watching this lesion.   Review of Systems     Objective:   Physical Exam Alert and in no distress. Tympanic membranes and canals are normal. Pharyngeal area is normal. Neck is supple with right anterior cervical painfuladenopathy no thyromegaly. Cardiac exam shows a regular sinus rhythm without murmurs or gallops. Lungs are clear to auscultation.Exam of the tongue visually shows no major lesions and palpably I felt nothing. Strep screen is negative      Assessment & Plan:  Sore throat - Plan: Rapid Strep A, amoxicillin (AMOXIL) 875 MG tablet  Lymphadenopathy of right cervical region - Plan: amoxicillin (AMOXIL) 875 MG tablet I discussed options with her concerning antibody versus possibly getting a scan on her neck. We have both agreed that a course of anabiotic would not be unreasonable however if no response, I will order a scan.

## 2015-02-09 ENCOUNTER — Encounter: Payer: Self-pay | Admitting: Internal Medicine

## 2015-08-31 ENCOUNTER — Ambulatory Visit (INDEPENDENT_AMBULATORY_CARE_PROVIDER_SITE_OTHER): Payer: BLUE CROSS/BLUE SHIELD | Admitting: Family Medicine

## 2015-08-31 ENCOUNTER — Encounter: Payer: Self-pay | Admitting: Family Medicine

## 2015-08-31 VITALS — BP 100/66 | HR 80 | Temp 97.9°F | Resp 12 | Wt 165.2 lb

## 2015-08-31 DIAGNOSIS — Z23 Encounter for immunization: Secondary | ICD-10-CM

## 2015-08-31 DIAGNOSIS — J069 Acute upper respiratory infection, unspecified: Secondary | ICD-10-CM | POA: Diagnosis not present

## 2015-08-31 MED ORDER — AZITHROMYCIN 500 MG PO TABS: 500.0000 mg | ORAL_TABLET | Freq: Every day | ORAL | Status: DC

## 2015-08-31 NOTE — Progress Notes (Signed)
   Subjective:    Patient ID: Mary [name], female    DOB: 1960/08/19, 55 y.o.   MRN: [id number]  HPI She complains of a one-week history this started with chest congestion followed by cough, nasal congestion and slight sore throat. No fever, chills, earache. She does not smoke.   Review of Systems     Objective:   Physical Exam Alert and in no distress. Tympanic membranes and canals are normal. Pharyngeal area is normal. Neck is supple without adenopathy or thyromegaly. Cardiac exam shows a regular sinus rhythm without murmurs or gallops. Lungs are clear to auscultation.        Assessment & Plan:  Need for prophylactic vaccination and inoculation against influenza - Plan: Flu Vaccine QUAD 36+ mos IM  Acute URI - Plan: azithromycin (ZITHROMAX) 500 MG tablet I explained that I think this is probably a URI but with the weekend coming up, I will give her an antibiotic but recommend she wait a couple of days. She would also like a flu shot which was given.

## 2015-08-31 NOTE — Patient Instructions (Signed)
Treat your symptoms but if you not better in a couple days go ahead and fill the antibiotic

## 2015-12-07 ENCOUNTER — Other Ambulatory Visit: Payer: Self-pay | Admitting: Obstetrics and Gynecology

## 2015-12-07 DIAGNOSIS — R928 Other abnormal and inconclusive findings on diagnostic imaging of breast: Secondary | ICD-10-CM

## 2015-12-11 ENCOUNTER — Ambulatory Visit
Admission: RE | Admit: 2015-12-11 | Discharge: 2015-12-11 | Disposition: A | Discharge status: Home / Self Care | Payer: BLUE CROSS/BLUE SHIELD | Source: Ambulatory Visit | Attending: Obstetrics and Gynecology | Admitting: Obstetrics and Gynecology

## 2015-12-11 DIAGNOSIS — R928 Other abnormal and inconclusive findings on diagnostic imaging of breast: Secondary | ICD-10-CM

## 2017-01-28 DIAGNOSIS — Z01419 Encounter for gynecological examination (general) (routine) without abnormal findings: Secondary | ICD-10-CM | POA: Diagnosis not present

## 2017-01-28 DIAGNOSIS — N898 Other specified noninflammatory disorders of vagina: Secondary | ICD-10-CM | POA: Diagnosis not present

## 2017-01-28 DIAGNOSIS — Z6830 Body mass index (BMI) 30.0-30.9, adult: Secondary | ICD-10-CM | POA: Diagnosis not present

## 2017-01-28 DIAGNOSIS — Z1231 Encounter for screening mammogram for malignant neoplasm of breast: Secondary | ICD-10-CM | POA: Diagnosis not present

## 2017-01-28 DIAGNOSIS — Z130 Encounter for screening for diseases of the blood and blood-forming organs and certain disorders involving the immune mechanism: Secondary | ICD-10-CM | POA: Diagnosis not present

## 2017-01-28 DIAGNOSIS — Z1389 Encounter for screening for other disorder: Secondary | ICD-10-CM | POA: Diagnosis not present

## 2017-01-28 DIAGNOSIS — Z124 Encounter for screening for malignant neoplasm of cervix: Secondary | ICD-10-CM | POA: Diagnosis not present

## 2017-01-28 LAB — HM PAP SMEAR

## 2017-02-02 ENCOUNTER — Encounter: Payer: Self-pay | Admitting: *Deleted

## 2018-03-03 DIAGNOSIS — Z1389 Encounter for screening for other disorder: Secondary | ICD-10-CM | POA: Diagnosis not present

## 2018-03-03 DIAGNOSIS — Z6831 Body mass index (BMI) 31.0-31.9, adult: Secondary | ICD-10-CM | POA: Diagnosis not present

## 2018-03-03 DIAGNOSIS — Z01419 Encounter for gynecological examination (general) (routine) without abnormal findings: Secondary | ICD-10-CM | POA: Diagnosis not present

## 2018-03-03 DIAGNOSIS — Z124 Encounter for screening for malignant neoplasm of cervix: Secondary | ICD-10-CM | POA: Diagnosis not present

## 2018-03-03 DIAGNOSIS — Z1231 Encounter for screening mammogram for malignant neoplasm of breast: Secondary | ICD-10-CM | POA: Diagnosis not present

## 2018-03-03 DIAGNOSIS — Z130 Encounter for screening for diseases of the blood and blood-forming organs and certain disorders involving the immune mechanism: Secondary | ICD-10-CM | POA: Diagnosis not present

## 2018-03-04 DIAGNOSIS — R87619 Unspecified abnormal cytological findings in specimens from cervix uteri: Secondary | ICD-10-CM | POA: Diagnosis not present

## 2018-03-04 DIAGNOSIS — R87610 Atypical squamous cells of undetermined significance on cytologic smear of cervix (ASC-US): Secondary | ICD-10-CM | POA: Diagnosis not present

## 2018-03-04 DIAGNOSIS — Z124 Encounter for screening for malignant neoplasm of cervix: Secondary | ICD-10-CM | POA: Diagnosis not present

## 2018-08-13 ENCOUNTER — Encounter: Payer: Self-pay | Admitting: Family Medicine

## 2018-08-13 ENCOUNTER — Ambulatory Visit: Payer: BLUE CROSS/BLUE SHIELD | Admitting: Family Medicine

## 2018-08-13 VITALS — BP 118/78 | HR 91 | Wt 165.8 lb

## 2018-08-13 DIAGNOSIS — K219 Gastro-esophageal reflux disease without esophagitis: Secondary | ICD-10-CM

## 2018-08-13 DIAGNOSIS — R002 Palpitations: Secondary | ICD-10-CM | POA: Diagnosis not present

## 2018-08-13 DIAGNOSIS — R5383 Other fatigue: Secondary | ICD-10-CM

## 2018-08-13 DIAGNOSIS — Z23 Encounter for immunization: Secondary | ICD-10-CM

## 2018-08-13 DIAGNOSIS — M545 Low back pain, unspecified: Secondary | ICD-10-CM

## 2018-08-13 MED ORDER — LANSOPRAZOLE 30 MG PO CPDR: 30.0000 mg | DELAYED_RELEASE_CAPSULE | Freq: Every day | ORAL | 3 refills | Status: DC
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
## 2018-08-14 LAB — CBC WITH DIFFERENTIAL/PLATELET
Basophils Absolute: 0 10*3/uL (ref 0.0–0.2)
Basos: 1 %
EOS (ABSOLUTE): 0.2 10*3/uL (ref 0.0–0.4)
EOS: 3 %
HEMOGLOBIN: 12.9 g/dL (ref 11.1–15.9)
Hematocrit: 37.6 % (ref 34.0–46.6)
IMMATURE GRANULOCYTES: 0 %
Immature Grans (Abs): 0 10*3/uL (ref 0.0–0.1)
LYMPHS ABS: 3.2 10*3/uL — AB (ref 0.7–3.1)
LYMPHS: 42 %
MCH: 30.6 pg (ref 26.6–33.0)
MCHC: 34.3 g/dL (ref 31.5–35.7)
MCV: 89 fL (ref 79–97)
MONOCYTES: 8 %
Monocytes Absolute: 0.6 10*3/uL (ref 0.1–0.9)
NEUTROS PCT: 46 %
Neutrophils Absolute: 3.5 10*3/uL (ref 1.4–7.0)
Platelets: 277 10*3/uL (ref 150–450)
RBC: 4.21 x10E6/uL (ref 3.77–5.28)
RDW: 12.9 % (ref 12.3–15.4)
WBC: 7.7 10*3/uL (ref 3.4–10.8)

## 2018-08-14 LAB — COMPREHENSIVE METABOLIC PANEL
ALT: 16 IU/L (ref 0–32)
AST: 13 IU/L (ref 0–40)
Albumin/Globulin Ratio: 1.9 (ref 1.2–2.2)
Albumin: 4.4 g/dL (ref 3.5–5.5)
Alkaline Phosphatase: 93 IU/L (ref 39–117)
BUN / CREAT RATIO: 25 — AB (ref 9–23)
BUN: 21 mg/dL (ref 6–24)
Bilirubin Total: <0.2 mg/dL (ref 0.0–1.2)
CHLORIDE: 106 mmol/L (ref 96–106)
CO2: 20 mmol/L (ref 20–29)
CREATININE: 0.84 mg/dL (ref 0.57–1.00)
Calcium: 9.2 mg/dL (ref 8.7–10.2)
GFR, EST AFRICAN AMERICAN: 90 mL/min/{1.73_m2} (ref >59)
GFR, EST NON AFRICAN AMERICAN: 78 mL/min/{1.73_m2} (ref >59)
Globulin, Total: 2.3 g/dL (ref 1.5–4.5)
Glucose: 88 mg/dL (ref 65–99)
Potassium: 4.1 mmol/L (ref 3.5–5.2)
Sodium: 144 mmol/L (ref 134–144)
Total Protein: 6.7 g/dL (ref 6.0–8.5)

## 2018-08-14 LAB — TSH: TSH: 1.47 u[IU]/mL (ref 0.450–4.500)

## 2018-08-20 ENCOUNTER — Telehealth: Payer: Self-pay

## 2018-08-20 DIAGNOSIS — M545 Low back pain: Principal | ICD-10-CM

## 2018-08-20 DIAGNOSIS — G8929 Other chronic pain: Secondary | ICD-10-CM

## 2018-08-20 NOTE — Telephone Encounter (Signed)
Let her know that I went ahead and ordered the x-ray since she can go by anytime

## 2018-08-20 NOTE — Telephone Encounter (Signed)
Pt was advised Mary [name] 

## 2018-08-20 NOTE — Telephone Encounter (Signed)
Pt is not interested in wearing as event monitor. Pt is also asking if having some imaging done to her back would help find out what is going on with her back. Pt is not interested in physical therapy. Please advise. KH

## 2018-10-25 DIAGNOSIS — R05 Cough: Secondary | ICD-10-CM | POA: Diagnosis not present

## 2018-10-25 DIAGNOSIS — J00 Acute nasopharyngitis [common cold]: Secondary | ICD-10-CM | POA: Diagnosis not present

## 2018-10-26 ENCOUNTER — Encounter: Payer: BLUE CROSS/BLUE SHIELD | Admitting: Family Medicine

## 2019-03-09 DIAGNOSIS — Z124 Encounter for screening for malignant neoplasm of cervix: Secondary | ICD-10-CM | POA: Diagnosis not present

## 2019-03-09 DIAGNOSIS — Z1231 Encounter for screening mammogram for malignant neoplasm of breast: Secondary | ICD-10-CM | POA: Diagnosis not present

## 2019-03-09 DIAGNOSIS — Z1389 Encounter for screening for other disorder: Secondary | ICD-10-CM | POA: Diagnosis not present

## 2019-03-09 DIAGNOSIS — Z130 Encounter for screening for diseases of the blood and blood-forming organs and certain disorders involving the immune mechanism: Secondary | ICD-10-CM | POA: Diagnosis not present

## 2019-03-09 DIAGNOSIS — Z01419 Encounter for gynecological examination (general) (routine) without abnormal findings: Secondary | ICD-10-CM | POA: Diagnosis not present

## 2019-03-10 DIAGNOSIS — Z01419 Encounter for gynecological examination (general) (routine) without abnormal findings: Secondary | ICD-10-CM | POA: Diagnosis not present

## 2019-03-10 DIAGNOSIS — Z124 Encounter for screening for malignant neoplasm of cervix: Secondary | ICD-10-CM | POA: Diagnosis not present

## 2019-05-04 DIAGNOSIS — K429 Umbilical hernia without obstruction or gangrene: Secondary | ICD-10-CM | POA: Insufficient documentation

## 2019-05-05 DIAGNOSIS — R87619 Unspecified abnormal cytological findings in specimens from cervix uteri: Secondary | ICD-10-CM | POA: Insufficient documentation

## 2019-05-05 DIAGNOSIS — N938 Other specified abnormal uterine and vaginal bleeding: Secondary | ICD-10-CM | POA: Insufficient documentation

## 2019-05-05 DIAGNOSIS — A6000 Herpesviral infection of urogenital system, unspecified: Secondary | ICD-10-CM | POA: Insufficient documentation

## 2019-07-17 ENCOUNTER — Other Ambulatory Visit: Payer: Self-pay | Admitting: Family Medicine

## 2019-07-17 DIAGNOSIS — K219 Gastro-esophageal reflux disease without esophagitis: Secondary | ICD-10-CM

## 2020-04-10 ENCOUNTER — Ambulatory Visit: Admission: RE | Admit: 2020-04-10 | Payer: BLUE CROSS/BLUE SHIELD | Source: Ambulatory Visit

## 2020-04-10 ENCOUNTER — Other Ambulatory Visit: Payer: Self-pay

## 2020-05-08 ENCOUNTER — Encounter: Payer: Self-pay | Admitting: Family Medicine

## 2022-03-20 DIAGNOSIS — K753 Granulomatous hepatitis, not elsewhere classified: Secondary | ICD-10-CM | POA: Insufficient documentation

## 2022-03-21 ENCOUNTER — Other Ambulatory Visit: Payer: Self-pay | Admitting: Nurse Practitioner

## 2022-03-21 ENCOUNTER — Ambulatory Visit
Admission: RE | Admit: 2022-03-21 | Discharge: 2022-03-21 | Disposition: A | Discharge status: Home / Self Care | Payer: BC Managed Care – PPO | Source: Ambulatory Visit | Attending: Nurse Practitioner | Admitting: Nurse Practitioner

## 2022-03-21 ENCOUNTER — Ambulatory Visit: Payer: 59

## 2022-03-21 DIAGNOSIS — K753 Granulomatous hepatitis, not elsewhere classified: Secondary | ICD-10-CM

## 2022-06-02 ENCOUNTER — Ambulatory Visit: Payer: BC Managed Care – PPO | Admitting: Urology

## 2022-06-02 ENCOUNTER — Encounter: Payer: Self-pay | Admitting: Urology

## 2022-06-02 VITALS — Ht 60.0 in | Wt 178.0 lb

## 2022-06-02 DIAGNOSIS — N3946 Mixed incontinence: Secondary | ICD-10-CM

## 2022-06-02 DIAGNOSIS — R32 Unspecified urinary incontinence: Secondary | ICD-10-CM

## 2022-06-02 LAB — URINALYSIS, COMPLETE
Bilirubin, UA: NEGATIVE
Glucose, UA: NEGATIVE
Ketones, UA: NEGATIVE
Nitrite, UA: NEGATIVE
Protein,UA: NEGATIVE
RBC, UA: NEGATIVE
Specific Gravity, UA: 1.03 (ref 1.005–1.030)
Urobilinogen, Ur: 0.2 mg/dL (ref 0.2–1.0)
pH, UA: 5 (ref 5.0–7.5)

## 2022-06-02 LAB — MICROSCOPIC EXAMINATION
Epithelial Cells (non renal): >10 /hpf — AB (ref 0–10)
WBC, UA: >30 /hpf — AB (ref 0–5)

## 2022-06-02 MED ORDER — MIRABEGRON ER 50 MG PO TB24: 50.0000 mg | ORAL_TABLET | Freq: Every day | ORAL | 11 refills | Status: DC
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
## 2022-06-05 LAB — CULTURE, URINE COMPREHENSIVE

## 2022-06-30 ENCOUNTER — Other Ambulatory Visit: Payer: BC Managed Care – PPO | Admitting: Urology

## 2022-07-21 ENCOUNTER — Ambulatory Visit: Payer: BC Managed Care – PPO | Admitting: Urology

## 2022-07-21 ENCOUNTER — Encounter: Payer: Self-pay | Admitting: Urology

## 2022-07-21 VITALS — BP 119/77 | HR 96 | Ht 60.0 in | Wt 178.0 lb

## 2022-07-21 DIAGNOSIS — N3946 Mixed incontinence: Secondary | ICD-10-CM | POA: Diagnosis not present

## 2022-07-21 LAB — MICROSCOPIC EXAMINATION

## 2022-07-21 LAB — URINALYSIS, COMPLETE
Bilirubin, UA: NEGATIVE
Glucose, UA: NEGATIVE
Ketones, UA: NEGATIVE
Nitrite, UA: NEGATIVE
Protein,UA: NEGATIVE
RBC, UA: NEGATIVE
Specific Gravity, UA: 1.02 (ref 1.005–1.030)
Urobilinogen, Ur: 0.2 mg/dL (ref 0.2–1.0)
pH, UA: 5 (ref 5.0–7.5)
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
## 2022-07-23 LAB — CULTURE, URINE COMPREHENSIVE

## 2022-08-07 ENCOUNTER — Ambulatory Visit
Admission: EM | Admit: 2022-08-07 | Discharge: 2022-08-07 | Disposition: A | Discharge status: Home / Self Care | Payer: BC Managed Care – PPO | Attending: Urgent Care | Admitting: Urgent Care

## 2022-08-07 DIAGNOSIS — J069 Acute upper respiratory infection, unspecified: Secondary | ICD-10-CM | POA: Diagnosis not present

## 2022-08-07 MED ORDER — ALBUTEROL SULFATE HFA 108 (90 BASE) MCG/ACT IN AERS: 1.0000 | INHALATION_SPRAY | Freq: Four times a day (QID) | RESPIRATORY_TRACT | 0 refills | Status: AC | PRN

## 2022-08-07 NOTE — Discharge Instructions (Addendum)
You have been diagnosed with a viral upper respiratory infection based on your symptoms and exam. Viral illnesses cannot be treated with antibiotics - they are self limiting - and you should find your symptoms resolving within a few days. Get plenty of rest and non-caffeinated fluids. Watch for signs of dehydration including reduced urine output and dark colored urine.  We recommend you use over-the-counter medications for symptom control including acetaminophen (Tylenol), ibuprofen (Advil/Motrin) or naproxen (Aleve) for fever, chills or body aches. You may combine use of acetaminophen and ibuprofen/naproxen if needed. Also recommend cold/cough medication, but please note that some cough medications are not recommended if you suffer from hypertension.    Saline mist spray is helpful for removing excess mucus from your nose.  Room humidifiers are helpful to ease breathing at night. I recommend guaifenesin (Mucinex) with plenty of water throughout the day to help thin and loosen mucus secretions in your respiratory passages.   If appropriate based upon your other medical problems, you might also find relief of nasal/sinus congestion symptoms by using a nasal decongestant such as Flonase (fluticasone) or Sudafed sinus (pseudoephedrine).  You will need to obtain Sudafed from behind the pharmacist counter.  Speak to the pharmacist to verify that you are not duplicating medications with other over-the-counter formulations that you may be using.   Follow up here or with your primary care provider if your symptoms are worsening or not improving.
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
## 2022-08-07 NOTE — ED Triage Notes (Signed)
Pt. Presents to UC w/ c/o sinus pressure,headache and chest congestion for the past 4 days.

## 2022-09-01 ENCOUNTER — Ambulatory Visit: Payer: BC Managed Care – PPO | Admitting: Urology

## 2022-09-08 ENCOUNTER — Ambulatory Visit (INDEPENDENT_AMBULATORY_CARE_PROVIDER_SITE_OTHER): Payer: 59 | Admitting: Urology

## 2022-09-08 VITALS — BP 111/72 | HR 88 | Ht 60.0 in | Wt 178.5 lb

## 2022-09-08 DIAGNOSIS — N3946 Mixed incontinence: Secondary | ICD-10-CM | POA: Diagnosis not present

## 2022-09-08 MED ORDER — GEMTESA 75 MG PO TABS: 1.0000 | ORAL_TABLET | Freq: Every day | ORAL | 5 refills | Status: DC
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
## 2022-10-20 ENCOUNTER — Ambulatory Visit: Payer: Self-pay | Admitting: Urology

## 2022-11-03 ENCOUNTER — Other Ambulatory Visit (HOSPITAL_COMMUNITY): Payer: Self-pay

## 2022-11-03 ENCOUNTER — Other Ambulatory Visit (HOSPITAL_BASED_OUTPATIENT_CLINIC_OR_DEPARTMENT_OTHER): Payer: Self-pay

## 2022-11-03 MED ORDER — VYVANSE 20 MG PO CAPS
20.0000 mg | ORAL_CAPSULE | Freq: Every morning | ORAL | 0 refills | Status: DC
  Filled 2022-11-03: qty 30, 30d supply, fill #0

## 2022-11-05 ENCOUNTER — Other Ambulatory Visit (HOSPITAL_BASED_OUTPATIENT_CLINIC_OR_DEPARTMENT_OTHER): Payer: Self-pay

## 2022-11-05 ENCOUNTER — Other Ambulatory Visit (HOSPITAL_COMMUNITY): Payer: Self-pay

## 2022-11-05 MED ORDER — LISDEXAMFETAMINE DIMESYLATE 20 MG PO CAPS
20.0000 mg | ORAL_CAPSULE | Freq: Every day | ORAL | 0 refills | Status: DC
  Filled 2022-11-05: qty 30, 30d supply, fill #0

## 2022-11-06 ENCOUNTER — Other Ambulatory Visit: Payer: Self-pay

## 2022-12-01 ENCOUNTER — Encounter: Payer: Self-pay | Admitting: Urology

## 2022-12-01 ENCOUNTER — Ambulatory Visit: Payer: Self-pay | Admitting: Urology

## 2022-12-11 ENCOUNTER — Other Ambulatory Visit (HOSPITAL_COMMUNITY): Payer: Self-pay

## 2022-12-11 MED ORDER — LISDEXAMFETAMINE DIMESYLATE 50 MG PO CAPS
50.0000 mg | ORAL_CAPSULE | Freq: Every day | ORAL | 0 refills | Status: DC
  Filled 2022-12-11: qty 30, 30d supply, fill #0

## 2022-12-24 ENCOUNTER — Other Ambulatory Visit (HOSPITAL_COMMUNITY): Payer: Self-pay

## 2023-02-04 ENCOUNTER — Encounter: Payer: Self-pay | Admitting: Dermatology

## 2023-02-04 ENCOUNTER — Ambulatory Visit (INDEPENDENT_AMBULATORY_CARE_PROVIDER_SITE_OTHER): Payer: 59 | Admitting: Dermatology

## 2023-02-04 VITALS — BP 115/72 | HR 67

## 2023-02-04 DIAGNOSIS — L509 Urticaria, unspecified: Secondary | ICD-10-CM

## 2023-02-04 MED ORDER — TRIAMCINOLONE ACETONIDE 0.1 % EX CREA
TOPICAL_CREAM | CUTANEOUS | 2 refills | Status: AC
Start: 2023-02-04 — End: ?

## 2023-02-04 NOTE — Progress Notes (Signed)
   New Patient Visit   Subjective  Mary [name] is a 62 y.o. female who presents for the following: rash on all over body which started about 2 months . It does get itchy on a scale of 9 but that is periodic. It flares on different parts of the body and starts with small red bumps but then she gets welts. She was put on prednisone 3 days ago which has helped a lot. She was also put on a Benadryl and steroid IV when seen in ER Sat . Prior to the ER visit she had been using OTC itch cream which didn't help.Pt states she went to ER because her lips swelled up (she had sesame chicken and coffee w cream from [employer] earlier)   The following portions of the chart were reviewed this encounter and updated as appropriate: medications, allergies, medical history  Review of Systems:  No other skin or systemic complaints except as noted in HPI or Assessment and Plan.  Objective   .Left cheek left neck pink urticaria papules. Skin check negative but pt on prednisone  Well appearing patient in no apparent distress; mood and affect are within normal limits.   A focused examination was performed of the following areas:   Relevant exam findings are noted in the Assessment and Plan.  nuts,wheat,dairy,blueberry,ana w reflexive titers,thyroid, cmp,cbc   - Skin: No active lesions observed. Previously reported pink urticarial papules on the left cheek and left lateral neck. Photo on phone from recent reaction shows swollen lips   - Dermatographism: Negative test on the arm, potentially influenced by ongoing prednisone treatment.  Assessment & Plan   Mary [name] presented with a recurrent, itchy rash on her back and face, which flares up intermittently and was recently treated with IV Benadryl and steroids in the ER. She is currently on a tapering dose of oral prednisone. She reported recent lip swelling and a history of an autoimmune-related tongue lesion. Physical examination showed no active  lesions, and a dermatographism test was negative. Diagnostic tests were ordered to screen for allergens and autoimmune conditions, and a follow-up appointment was scheduled in three weeks.   1. Urticaria (Hives) - Continue oral prednisone as prescribed, tapering the dosage as instructed. - Start levocetirizine (Xyzal) 5 mg nightly for antihistamine coverage. - Prescribe topical steroid cream for immediate relief during flare-ups; apply twice daily to affected areas, except for one area to be biopsied in the future.  2. Suspected Allergy - Order blood tests for allergens, including nuts, sesame, dairy, and wheat.  3. Possible Autoimmune Condition - Order ANA with reflexive titers to screen for autoimmune conditions. - Consider further evaluation if ANA is positive.  Follow-up: - Schedule a follow-up appointment in three weeks to assess the patient's response to treatment and discuss biopsy if needed. - Review blood test results and discuss any necessary adjustments to the treatment plan.  Return in about 3 weeks (around 02/25/2023).  Mary [name], CMA, am acting as scribe for [employer], DO.   Documentation: I have reviewed the above documentation for accuracy and completeness, and I agree with the above.  Mary Reusing, DO

## 2023-02-04 NOTE — Patient Instructions (Addendum)
Thank you for visiting my office today. I appreciate your commitment to improving your health and addressing the concerns regarding your skin condition.  Based on our discussion and examination, here are the key instructions and next steps for your treatment plan:  - Medications:   - Continue taking Prednisone as prescribed: 60 mg, then taper down to 40 mg and 20 mg as per the schedule.   - Start Xyzal (levocetirizine) at bedtime for 24-hour relief from itching.   - Apply the prescribed Triamcinolone 0.1% cream to affected areas during flare-ups for immediate relief.  - Laboratory Tests:   - Blood tests for common allergens including nuts, dairy, and wheat.   - ANA test with reflexive titers to screen for autoimmune conditions.   - CBC, CMP, and TSH to check your overall health and organ function.  - Follow-Up:   - Schedule a follow-up appointment in three weeks. Please ensure you are off Prednisone by then to allow for accurate assessment and potential biopsy of any persistent skin lesions.  - Additional Notes:   - Avoid applying the cream to one area of any new flare to preserve the integrity of a potential biopsy site.  Please ensure to follow these instructions closely and keep me updated on any changes in your condition. If you have any questions or need further clarification, do not hesitate to contact our office.  Wishing you a swift and smooth recovery.       Due to recent changes in healthcare laws, you may see results of your pathology and/or laboratory studies on MyChart before the doctors have had a chance to review them. We understand that in some cases there may be results that are confusing or concerning to you. Please understand that not all results are received at the same time and often the doctors may need to interpret multiple results in order to provide you with the best plan of care or course of treatment. Therefore, we ask that you please give [place] 2 business days to  thoroughly review all your results before contacting the office for clarification. Should we see a critical lab result, you will be contacted sooner.   If You Need Anything After Your Visit  If you have any questions or concerns for your doctor, please call our main line at [phone] If no one answers, please leave a voicemail as directed and we will return your call as soon as possible. Messages left after 4 pm will be answered the following business day.   You may also send [place] a message via MyChart. We typically respond to MyChart messages within 1-2 business days.  For prescription refills, please ask your pharmacy to contact our office. Our fax number is [phone].  If you have an urgent issue when the clinic is closed that cannot wait until the next business day, you can page your doctor at the number below.    Please note that while we do our best to be available for urgent issues outside of office hours, we are not available 24/7.   If you have an urgent issue and are unable to reach [place], you may choose to seek medical care at your doctor's office, retail clinic, urgent care center, or emergency room.  If you have a medical emergency, please immediately call 911 or go to the emergency department. In the event of inclement weather, please call our main line at [phone] for an update on the status of any delays or closures.  Dermatology Medication  Tips: Please keep the boxes that topical medications come in in order to help keep track of the instructions about where and how to use these. Pharmacies typically print the medication instructions only on the boxes and not directly on the medication tubes.   If your medication is too expensive, please contact our office at [phone] or send [place] a message through MyChart.   We are unable to tell what your co-pay for medications will be in advance as this is different depending on your insurance coverage. However, we may be able to  find a substitute medication at lower cost or fill out paperwork to get insurance to cover a needed medication.   If a prior authorization is required to get your medication covered by your insurance company, please allow [place] 1-2 business days to complete this process.  Drug prices often vary depending on where the prescription is filled and some pharmacies may offer cheaper prices.  The website www.goodrx.com contains coupons for medications through different pharmacies. The prices here do not account for what the cost may be with help from insurance (it may be cheaper with your insurance), but the website can give you the price if you did not use any insurance.  - You can print the associated coupon and take it with your prescription to the pharmacy.  - You may also stop by our office during regular business hours and pick up a GoodRx coupon card.  - If you need your prescription sent electronically to a different pharmacy, notify our office through [facility] or by phone at [phone]

## 2023-02-05 ENCOUNTER — Ambulatory Visit: Payer: BLUE CROSS/BLUE SHIELD | Admitting: Allergy & Immunology
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
## 2023-02-08 LAB — ALLERGENS(7)
Brazil Nut IgE: <0.1 kU/L
F020-IgE Almond: <0.1 kU/L
F202-IgE Cashew Nut: <0.1 kU/L
Hazelnut (Filbert) IgE: <0.1 kU/L
Peanut IgE: <0.1 kU/L
Pecan Nut IgE: <0.1 kU/L
Walnut IgE: <0.1 kU/L

## 2023-02-08 LAB — CBC WITH DIFFERENTIAL/PLATELET
Basophils Absolute: 0.1 10*3/uL (ref 0.0–0.2)
Basos: 0 %
EOS (ABSOLUTE): 0.1 10*3/uL (ref 0.0–0.4)
Eos: 1 %
Hematocrit: 36.6 % (ref 34.0–46.6)
Hemoglobin: 12.1 g/dL (ref 11.1–15.9)
Immature Grans (Abs): 0.3 10*3/uL — ABNORMAL HIGH (ref 0.0–0.1)
Immature Granulocytes: 2 %
Lymphocytes Absolute: 3.6 10*3/uL — ABNORMAL HIGH (ref 0.7–3.1)
Lymphs: 26 %
MCH: 30 pg (ref 26.6–33.0)
MCHC: 33.1 g/dL (ref 31.5–35.7)
MCV: 91 fL (ref 79–97)
Monocytes Absolute: 0.7 10*3/uL (ref 0.1–0.9)
Monocytes: 5 %
Neutrophils Absolute: 8.9 10*3/uL — ABNORMAL HIGH (ref 1.4–7.0)
Neutrophils: 66 %
Platelets: 288 10*3/uL (ref 150–450)
RBC: 4.03 x10E6/uL (ref 3.77–5.28)
RDW: 13.1 % (ref 11.7–15.4)
WBC: 13.6 10*3/uL — ABNORMAL HIGH (ref 3.4–10.8)

## 2023-02-08 LAB — COMPREHENSIVE METABOLIC PANEL
ALT: 26 IU/L (ref 0–32)
AST: 14 IU/L (ref 0–40)
Albumin: 4.3 g/dL (ref 3.9–4.9)
Alkaline Phosphatase: 91 IU/L (ref 44–121)
BUN/Creatinine Ratio: 21 (ref 12–28)
BUN: 17 mg/dL (ref 8–27)
Bilirubin Total: 0.2 mg/dL (ref 0.0–1.2)
CO2: 22 mmol/L (ref 20–29)
Calcium: 9.2 mg/dL (ref 8.7–10.3)
Chloride: 105 mmol/L (ref 96–106)
Creatinine, Ser: 0.81 mg/dL (ref 0.57–1.00)
Globulin, Total: 2.2 g/dL (ref 1.5–4.5)
Glucose: 87 mg/dL (ref 70–99)
Potassium: 3.3 mmol/L — ABNORMAL LOW (ref 3.5–5.2)
Sodium: 143 mmol/L (ref 134–144)
Total Protein: 6.5 g/dL (ref 6.0–8.5)
eGFR: 83 mL/min/{1.73_m2} (ref >59)

## 2023-02-08 LAB — ALLERGEN PROFILE, FOOD-MILK
F076-IgE Alpha Lactalbumin: <0.1 kU/L
F077-IgE Beta Lactoglobulin: <0.1 kU/L
F078-IgE Casein: <0.1 kU/L
F081-IgE Cheese, Cheddar Type: <0.1 kU/L
F082-IgE Cheese, Mold Type: <0.1 kU/L
Milk IgE: <0.1 kU/L

## 2023-02-08 LAB — ALPHA-GAL PANEL
Allergen Lamb IgE: <0.1 kU/L
Beef IgE: <0.1 kU/L
IgE (Immunoglobulin E), Serum: 8 IU/mL (ref 6–495)
O215-IgE Alpha-Gal: <0.1 kU/L
Pork IgE: <0.1 kU/L

## 2023-02-08 LAB — THYROID PANEL WITH TSH
Free Thyroxine Index: 2.3 (ref 1.2–4.9)
T3 Uptake Ratio: 28 % (ref 24–39)
T4, Total: 8.2 ug/dL (ref 4.5–12.0)
TSH: 1.34 u[IU]/mL (ref 0.450–4.500)

## 2023-02-08 LAB — ANA,IFA RA DIAG PNL W/RFLX TIT/PATN
ANA Titer 1: NEGATIVE
Cyclic Citrullin Peptide Ab: 6 units (ref 0–19)
Rheumatoid fact SerPl-aCnc: <10 IU/mL (ref <14.0)

## 2023-02-08 LAB — ALLERGEN, BLUEBERRY, RF288: Allergen Blueberry IgE: <0.1 kU/L

## 2023-02-08 LAB — WHEAT IGE W/COMPONENT REFLEX
F004-IgE Wheat: <0.1 kU/L
F098-IgE Gliadin, Wheat: <0.1 kU/L
F416-IgE Tri a 19(w-5 gliadin): <0.1 kU/L

## 2023-02-09 ENCOUNTER — Ambulatory Visit (HOSPITAL_BASED_OUTPATIENT_CLINIC_OR_DEPARTMENT_OTHER): Payer: Self-pay | Admitting: Family Medicine

## 2023-02-10 ENCOUNTER — Ambulatory Visit (HOSPITAL_BASED_OUTPATIENT_CLINIC_OR_DEPARTMENT_OTHER): Payer: 59 | Admitting: Family Medicine

## 2023-02-10 ENCOUNTER — Encounter (HOSPITAL_BASED_OUTPATIENT_CLINIC_OR_DEPARTMENT_OTHER): Payer: Self-pay | Admitting: Family Medicine

## 2023-02-10 VITALS — BP 127/65 | HR 70 | Ht 60.0 in | Wt 175.9 lb

## 2023-02-10 DIAGNOSIS — Z1231 Encounter for screening mammogram for malignant neoplasm of breast: Secondary | ICD-10-CM

## 2023-02-10 DIAGNOSIS — E669 Obesity, unspecified: Secondary | ICD-10-CM | POA: Diagnosis not present

## 2023-02-10 DIAGNOSIS — L509 Urticaria, unspecified: Secondary | ICD-10-CM | POA: Insufficient documentation

## 2023-02-10 DIAGNOSIS — Z1211 Encounter for screening for malignant neoplasm of colon: Secondary | ICD-10-CM | POA: Diagnosis not present

## 2023-02-10 LAB — POCT GLYCOSYLATED HEMOGLOBIN (HGB A1C): Hemoglobin A1C: 5.6 % (ref 4.0–5.6)

## 2023-02-10 NOTE — Assessment & Plan Note (Signed)
Continue your Xyzal daily and use the steroid cream as needed. Continue with Dermatology management and follow up for possible biopsy/autoimmune workup.  Keeping a diary of foods, detergents, soaps, sunlight exposure and rash occurring may be helpful as well for identifying triggers.

## 2023-02-10 NOTE — Patient Instructions (Addendum)
Continue your Xyzal daily and use the steroid cream as needed. Continue with Dermatology management. Keeping a diary of foods, detergents, soaps, sunlight exposure and rash occurring may be helpful as well for identifying triggers.   Please call your insurance company and ask if they cover Wegovy (brand name of Ozempic for weight loss) or Zepbound (brand name of Mounjaro for weight loss). Ask what the monthly cost would be.

## 2023-04-08 ENCOUNTER — Other Ambulatory Visit: Payer: Self-pay | Admitting: Gynecology

## 2023-04-08 DIAGNOSIS — R928 Other abnormal and inconclusive findings on diagnostic imaging of breast: Secondary | ICD-10-CM

## 2023-04-22 ENCOUNTER — Other Ambulatory Visit: Payer: 59

## 2023-04-30 ENCOUNTER — Other Ambulatory Visit: Payer: 59

## 2023-04-30 ENCOUNTER — Ambulatory Visit
Admission: RE | Admit: 2023-04-30 | Discharge: 2023-04-30 | Disposition: A | Discharge status: Home / Self Care | Payer: 59 | Source: Ambulatory Visit | Attending: Gynecology | Admitting: Gynecology

## 2023-04-30 DIAGNOSIS — R928 Other abnormal and inconclusive findings on diagnostic imaging of breast: Secondary | ICD-10-CM

## 2023-05-15 ENCOUNTER — Ambulatory Visit (AMBULATORY_SURGERY_CENTER): Payer: 59 | Admitting: *Deleted

## 2023-05-15 VITALS — Ht 60.0 in | Wt 176.0 lb

## 2023-05-15 DIAGNOSIS — Z800 Family history of malignant neoplasm of digestive organs: Secondary | ICD-10-CM

## 2023-05-15 DIAGNOSIS — Z1211 Encounter for screening for malignant neoplasm of colon: Secondary | ICD-10-CM

## 2023-05-15 MED ORDER — NA SULFATE-K SULFATE-MG SULF 17.5-3.13-1.6 GM/177ML PO SOLN: 1.0000 | Freq: Once | ORAL | 0 refills | Status: AC

## 2023-05-15 NOTE — Progress Notes (Signed)
Pt's name and DOB verified at the beginning of the pre-visit wit 2 identifiers  Pt denies any difficulty with ambulating,sitting, laying down or rolling side to side  Gave both [facility] main # and MD on call # prior to instructions.   No egg or soy allergy known to patient   No issues known to pt with past sedation with any surgeries or procedures  Pt denies having issues being intubated  Pt has no issues moving head neck or swallowing  No FH of Malignant Hyperthermia  Pt is not on diet pills or shots  Pt is not on home 02   Pt is not on blood thinners   Pt denies issues with constipation   Pt has frequent issues with constipation RN instructed pt to use Miralax per bottles instructions a week before prep days. Pt states they will  Pt is not on dialysis  Pt denise any abnormal heart rhythms   Pt denies any upcoming cardiac testing  Pt encouraged to use to use Singlecare or Goodrx to reduce cost   Patient's chart reviewed by Cathlyn Parsons CNRA prior to pre-visit and patient appropriate for the [facility].  Pre-visit completed and red dot placed by patient's name on their procedure day (on provider's schedule).  .  Visit by phone  Pt states weight is 176 lb  Instructed pt why it is important to and  to call if they have any changes in health or new medications. Directed them to the # given and on instructions.     Instructions reviewed with pt and pt states understanding. Instructed to review again prior to procedure. Pt states they will.   Instructions sent by mail with coupon and by my chart

## 2023-05-20 ENCOUNTER — Encounter: Payer: Self-pay | Admitting: Internal Medicine

## 2023-06-03 ENCOUNTER — Encounter: Payer: 59 | Admitting: Internal Medicine

## 2023-07-27 ENCOUNTER — Encounter: Payer: 59 | Admitting: Internal Medicine

## 2023-09-14 ENCOUNTER — Encounter: Payer: 59 | Admitting: Internal Medicine

## 2023-10-19 ENCOUNTER — Telehealth: Payer: Self-pay | Admitting: *Deleted

## 2023-10-19 NOTE — Telephone Encounter (Signed)
 Attempt to reach pt for pre-visit. LM with call back #.  Will attempt to reach again in 5 min due to no other # listed in profile  Second attempt to reach pt for pre-vist unsuccessful. LM with facility # for pt to call back. Instructed pt to call # given by end of the day and reschedule the pre-visit  with RN or the scheduled procedure will be canceled.

## 2023-10-21 ENCOUNTER — Ambulatory Visit (AMBULATORY_SURGERY_CENTER)

## 2023-10-21 ENCOUNTER — Encounter

## 2023-10-21 VITALS — Ht 60.0 in | Wt 179.0 lb

## 2023-10-21 DIAGNOSIS — Z800 Family history of malignant neoplasm of digestive organs: Secondary | ICD-10-CM

## 2023-10-21 MED ORDER — NA SULFATE-K SULFATE-MG SULF 17.5-3.13-1.6 GM/177ML PO SOLN
1.0000 | Freq: Once | ORAL | 0 refills | Status: AC
Start: 2023-10-21 — End: 2023-10-21

## 2023-10-21 NOTE — Progress Notes (Signed)

## 2023-10-26 ENCOUNTER — Telehealth: Payer: Self-pay | Admitting: Internal Medicine

## 2023-10-26 NOTE — Telephone Encounter (Signed)
 Please see notes below.

## 2023-10-26 NOTE — Telephone Encounter (Signed)
 Patient called and stated that she received a letter in the mail stated that her colonoscopy was coded as surveillance. Patient stated that she is has not been surveillance and high risk in a long time. Patient stated that she would like it to be changed over to preventative. Patient is requesting a call back. Please advise.

## 2023-10-27 ENCOUNTER — Encounter: Payer: Self-pay | Admitting: Internal Medicine

## 2023-10-30 ENCOUNTER — Encounter: Payer: Self-pay | Admitting: Internal Medicine

## 2023-11-02 ENCOUNTER — Encounter: Payer: 59 | Admitting: Internal Medicine

## 2023-11-02 ENCOUNTER — Ambulatory Visit: Admitting: Internal Medicine

## 2023-11-02 ENCOUNTER — Encounter: Payer: Self-pay | Admitting: Internal Medicine

## 2023-11-02 VITALS — BP 104/68 | HR 74 | Temp 97.9°F | Resp 12 | Ht 60.0 in | Wt 179.0 lb

## 2023-11-02 DIAGNOSIS — D122 Benign neoplasm of ascending colon: Secondary | ICD-10-CM

## 2023-11-02 DIAGNOSIS — D123 Benign neoplasm of transverse colon: Secondary | ICD-10-CM | POA: Diagnosis not present

## 2023-11-02 DIAGNOSIS — K648 Other hemorrhoids: Secondary | ICD-10-CM | POA: Diagnosis not present

## 2023-11-02 DIAGNOSIS — Z1211 Encounter for screening for malignant neoplasm of colon: Secondary | ICD-10-CM | POA: Diagnosis present

## 2023-11-02 MED ORDER — SODIUM CHLORIDE 0.9 % IV SOLN: 500.0000 mL | INTRAVENOUS | Status: DC
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
## 2023-11-02 NOTE — Op Note (Signed)
 [facility] Patient Name: Mary [name] Procedure Date: 11/02/2023 1:21 PM MRN: [id number] Endoscopist: Madelyn Brunner [place] , , [id number] Age: 63 Referring MD:  Date of Birth: 03/09/1961 Gender: Female Account #: [id number] Procedure:                Colonoscopy Indications:              Screening for colorectal malignant neoplasm Medicines:                Monitored Anesthesia Care Procedure:                Pre-Anesthesia Assessment:                           - Prior to the procedure, a History and Physical                            was performed, and patient medications and                            allergies were reviewed. The patient's tolerance of                            previous anesthesia was also reviewed. The risks                            and benefits of the procedure and the sedation                            options and risks were discussed with the patient.                            All questions were answered, and informed consent                            was obtained. Prior Anticoagulants: The patient has                            taken no anticoagulant or antiplatelet agents. ASA                            Grade Assessment: II - A patient with mild systemic                            disease. After reviewing the risks and benefits,                            the patient was deemed in satisfactory condition to                            undergo the procedure.                           After obtaining informed consent, the colonoscope  was passed under direct vision. Throughout the                            procedure, the patient's blood pressure, pulse, and                            oxygen saturations were monitored continuously. The                            CF [id number] #[id number] was introduced through the anus                            and advanced to the the terminal ileum. The                            colonoscopy  was performed without difficulty. The                            patient tolerated the procedure well. The quality                            of the bowel preparation was excellent. The                            terminal ileum, ileocecal valve, appendiceal                            orifice, and rectum were photographed. Scope In: 1:40:14 PM Scope Out: 1:56:07 PM Scope Withdrawal Time: 0 hours 12 minutes 17 seconds  Total Procedure Duration: 0 hours 15 minutes 53 seconds  Findings:                 The terminal ileum appeared normal.                           Five sessile polyps were found in the transverse                            colon and ascending colon. The polyps were 3 to 6                            mm in size. These polyps were removed with a cold                            snare. Resection and retrieval were complete.                           Non-bleeding internal hemorrhoids were found during                            retroflexion. Complications:            No immediate complications. Estimated Blood Loss:     Estimated blood loss was minimal. Impression:               -  The examined portion of the ileum was normal.                           - Five 3 to 6 mm polyps in the transverse colon and                            in the ascending colon, removed with a cold snare.                            Resected and retrieved.                           - Non-bleeding internal hemorrhoids. Recommendation:           - Discharge patient to home (with escort).                           - Await pathology results.                           - The findings and recommendations were discussed                            with the patient. Dr Particia Lather "Alan Ripper" Maguayo,  11/02/2023 2:02:19 PM

## 2023-11-02 NOTE — Patient Instructions (Addendum)
 Resume previous diet Continue present medications Await pathology results Handouts/information given for polyps and hemorrhoids  YOU HAD AN ENDOSCOPIC PROCEDURE TODAY AT THE [facility]:   Refer to the procedure report that was given to you for any specific questions about what was found during the examination.  If the procedure report does not answer your questions, please call your gastroenterologist to clarify.  If you requested that your care partner not be given the details of your procedure findings, then the procedure report has been included in a sealed envelope for you to review at your convenience later.  YOU SHOULD EXPECT: Some feelings of bloating in the abdomen. Passage of more gas than usual.  Walking can help get rid of the air that was put into your GI tract during the procedure and reduce the bloating. If you had a lower endoscopy (such as a colonoscopy or flexible sigmoidoscopy) you may notice spotting of blood in your stool or on the toilet paper. If you underwent a bowel prep for your procedure, you may not have a normal bowel movement for a few days.  Please Note:  You might notice some irritation and congestion in your nose or some drainage.  This is from the oxygen used during your procedure.  There is no need for concern and it should clear up in a day or so.  SYMPTOMS TO REPORT IMMEDIATELY:  Following lower endoscopy (colonoscopy):  Excessive amounts of blood in the stool  Significant tenderness or worsening of abdominal pains  Swelling of the abdomen that is new, acute  Fever of 100F or higher  For urgent or emergent issues, a gastroenterologist can be reached at any hour by calling [phone]. Do not use MyChart messaging for urgent concerns.   DIET:  We do recommend a small meal at first, but then you may proceed to your regular diet.  Drink plenty of fluids but you should avoid alcoholic beverages for 24 hours.  ACTIVITY:  You should plan to take  it easy for the rest of today and you should NOT DRIVE or use heavy machinery until tomorrow (because of the sedation medicines used during the test).    FOLLOW UP: Our staff will call the number listed on your records the next business day following your procedure.  We will call around 7:15- 8:00 am to check on you and address any questions or concerns that you may have regarding the information given to you following your procedure. If we do not reach you, we will leave a message.     If any biopsies were taken you will be contacted by phone or by letter within the next 1-3 weeks.  Please call us at [phone] if you have not heard about the biopsies in 3 weeks.    SIGNATURES/CONFIDENTIALITY: You and/or your care partner have signed paperwork which will be entered into your electronic medical record.  These signatures attest to the fact that that the information above on your After Visit Summary has been reviewed and is understood.  Full responsibility of the confidentiality of this discharge information lies with you and/or your care-partner.

## 2023-11-02 NOTE — Progress Notes (Signed)
 Pt's states no medical or surgical changes since previsit or office visit.

## 2023-11-02 NOTE — Progress Notes (Signed)
 A/O x 3, gd SR's, VSS, report to RN

## 2023-11-02 NOTE — Progress Notes (Signed)
 Called to room to assist during endoscopic procedure.  Patient ID and intended procedure confirmed with present staff. Received instructions for my participation in the procedure from the performing physician.

## 2023-11-03 ENCOUNTER — Telehealth: Payer: Self-pay

## 2023-11-03 NOTE — Telephone Encounter (Signed)
  Follow up Call-     11/02/2023    1:17 PM  Call back number  Post procedure Call Back phone  # [phone]  Permission to leave phone message Yes     Patient questions:  Do you have a fever, pain , or abdominal swelling? No. Pain Score  0 *  Have you tolerated food without any problems? Yes.    Have you been able to return to your normal activities? Yes.    Do you have any questions about your discharge instructions: Diet   No. Medications  No. Follow up visit  No.  Do you have questions or concerns about your Care? Yes.  Patient expressed having some mild nausea; RN advised patient to drink Gingerale and other fluids, including water. RN also suggested avoiding spicy or gas-producing foods until the nausea eases. Patient stated understanding.  Actions: * If pain score is 4 or above: No action needed, pain <4.

## 2023-11-05 ENCOUNTER — Encounter: Payer: Self-pay | Admitting: Internal Medicine

## 2023-11-05 LAB — SURGICAL PATHOLOGY

## 2024-03-21 ENCOUNTER — Telehealth (HOSPITAL_BASED_OUTPATIENT_CLINIC_OR_DEPARTMENT_OTHER): Payer: Self-pay | Admitting: *Deleted

## 2024-03-21 NOTE — Telephone Encounter (Signed)
 That is ok.  I think my new patient appts are booked out until December.

## 2024-03-21 NOTE — Telephone Encounter (Signed)
 Spoke with pt, pt states she was looking for a sooner appt then later this year. Pt states she'd call around to other offices for sooner slots before scheduling with Dr. Watt

## 2024-03-21 NOTE — Telephone Encounter (Signed)
 Copied from CRM [id number]. Topic: Appointments - Transfer of Care >> Mar 21, 2024 12:55 PM Deleta S wrote: Pt is requesting to transfer FROM: [facility] Pt is requesting to transfer TO: [facility]  Reason for requested transfer: personal reasons It is the responsibility of the team the patient would like to transfer to (Dr. Watt, spencer ) to reach out to the patient if for any reason this transfer is not acceptable.

## 2024-03-21 NOTE — Telephone Encounter (Signed)
 When pt does transfer to new PCP office, they should be able to see records from [facility] as long as they are within [facility] system.  Removing Thersia Stark as PCP since pt is wanting to change.  Routing encounter to Dr. Ubaldo so he can send to either his assistant or front staff about getting pt set up with an appt if they are okay to take her on as a new patient.

## 2024-09-06 ENCOUNTER — Ambulatory Visit: Admitting: Dermatology
